# Patient Record
Sex: Male | Born: 1955 | Race: White | Hispanic: No | Smoking: Current every day smoker
Health system: Southern US, Community
[De-identification: ages and names within clinical notes are randomized; demographics above are authoritative.]

## PROBLEM LIST (undated history)

## (undated) DIAGNOSIS — I10 Essential (primary) hypertension: Secondary | ICD-10-CM

## (undated) DIAGNOSIS — E119 Type 2 diabetes mellitus without complications: Secondary | ICD-10-CM

## (undated) DIAGNOSIS — M109 Gout, unspecified: Secondary | ICD-10-CM

## (undated) HISTORY — PX: GASTRIC BYPASS: SHX52

---

## 2002-07-01 ENCOUNTER — Encounter: Admission: RE | Admit: 2002-07-01 | Discharge: 2002-09-29 | Payer: Self-pay | Admitting: Family Medicine

## 2002-11-03 ENCOUNTER — Encounter: Admission: RE | Admit: 2002-11-03 | Discharge: 2003-02-01 | Payer: Self-pay | Admitting: Family Medicine

## 2004-06-19 ENCOUNTER — Encounter (INDEPENDENT_AMBULATORY_CARE_PROVIDER_SITE_OTHER): Payer: Self-pay | Admitting: *Deleted

## 2004-06-19 ENCOUNTER — Ambulatory Visit (HOSPITAL_COMMUNITY): Admission: RE | Admit: 2004-06-19 | Discharge: 2004-06-19 | Payer: Self-pay | Admitting: Gastroenterology

## 2009-03-24 ENCOUNTER — Ambulatory Visit (HOSPITAL_BASED_OUTPATIENT_CLINIC_OR_DEPARTMENT_OTHER): Admission: RE | Admit: 2009-03-24 | Discharge: 2009-03-24 | Payer: Self-pay | Admitting: Podiatry

## 2009-06-28 ENCOUNTER — Ambulatory Visit (HOSPITAL_COMMUNITY): Admission: RE | Admit: 2009-06-28 | Discharge: 2009-06-28 | Payer: Self-pay | Admitting: Surgery

## 2009-07-05 ENCOUNTER — Ambulatory Visit (HOSPITAL_COMMUNITY): Admission: RE | Admit: 2009-07-05 | Discharge: 2009-07-05 | Payer: Self-pay | Admitting: Surgery

## 2009-08-14 ENCOUNTER — Ambulatory Visit (HOSPITAL_BASED_OUTPATIENT_CLINIC_OR_DEPARTMENT_OTHER): Admission: RE | Admit: 2009-08-14 | Discharge: 2009-08-14 | Payer: Self-pay | Admitting: Surgery

## 2009-08-16 ENCOUNTER — Encounter: Admission: RE | Admit: 2009-08-16 | Discharge: 2009-10-13 | Payer: Self-pay | Admitting: Surgery

## 2009-08-20 ENCOUNTER — Ambulatory Visit: Payer: Self-pay | Admitting: Internal Medicine

## 2009-08-29 ENCOUNTER — Ambulatory Visit (HOSPITAL_COMMUNITY): Admission: RE | Admit: 2009-08-29 | Discharge: 2009-08-29 | Payer: Self-pay | Admitting: Surgery

## 2009-11-01 ENCOUNTER — Ambulatory Visit (HOSPITAL_COMMUNITY): Admission: RE | Admit: 2009-11-01 | Discharge: 2009-11-01 | Payer: Self-pay | Admitting: Gastroenterology

## 2009-11-10 ENCOUNTER — Encounter
Admission: RE | Admit: 2009-11-10 | Discharge: 2010-01-24 | Payer: Self-pay | Source: Home / Self Care | Attending: Surgery | Admitting: Surgery

## 2009-11-23 ENCOUNTER — Ambulatory Visit (HOSPITAL_COMMUNITY): Admission: RE | Admit: 2009-11-23 | Discharge: 2009-11-23 | Payer: Self-pay | Admitting: Surgery

## 2010-01-10 ENCOUNTER — Ambulatory Visit (HOSPITAL_COMMUNITY)
Admission: RE | Admit: 2010-01-10 | Discharge: 2010-01-11 | Payer: Self-pay | Source: Home / Self Care | Attending: Surgery | Admitting: Surgery

## 2010-01-24 ENCOUNTER — Encounter: Admit: 2010-01-24 | Discharge: 2010-02-14 | Payer: Self-pay | Attending: Surgery | Admitting: Surgery

## 2010-03-07 ENCOUNTER — Encounter: Payer: BC Managed Care – PPO | Attending: Surgery | Admitting: *Deleted

## 2010-03-07 ENCOUNTER — Encounter: Admit: 2010-03-07 | Payer: Self-pay | Admitting: Surgery

## 2010-03-07 DIAGNOSIS — Z713 Dietary counseling and surveillance: Secondary | ICD-10-CM | POA: Insufficient documentation

## 2010-03-07 DIAGNOSIS — Z9884 Bariatric surgery status: Secondary | ICD-10-CM | POA: Insufficient documentation

## 2010-03-07 DIAGNOSIS — Z09 Encounter for follow-up examination after completed treatment for conditions other than malignant neoplasm: Secondary | ICD-10-CM | POA: Insufficient documentation

## 2010-03-27 LAB — DIFFERENTIAL
Basophils Absolute: 0 10*3/uL (ref 0.0–0.1)
Eosinophils Relative: 0 % (ref 0–5)
Lymphocytes Relative: 11 % — ABNORMAL LOW (ref 12–46)
Lymphocytes Relative: 30 % (ref 12–46)
Lymphs Abs: 1.9 10*3/uL (ref 0.7–4.0)
Lymphs Abs: 2.9 10*3/uL (ref 0.7–4.0)
Monocytes Absolute: 0.9 10*3/uL (ref 0.1–1.0)
Monocytes Relative: 10 % (ref 3–12)
Neutro Abs: 5.7 10*3/uL (ref 1.7–7.7)

## 2010-03-27 LAB — COMPREHENSIVE METABOLIC PANEL
Albumin: 3.9 g/dL (ref 3.5–5.2)
BUN: 13 mg/dL (ref 6–23)
Calcium: 9.5 mg/dL (ref 8.4–10.5)
Creatinine, Ser: 1.04 mg/dL (ref 0.4–1.5)
Total Protein: 7.7 g/dL (ref 6.0–8.3)

## 2010-03-27 LAB — CBC
HCT: 45.1 % (ref 39.0–52.0)
MCH: 31.8 pg (ref 26.0–34.0)
MCV: 95.8 fL (ref 78.0–100.0)
MCV: 95.8 fL (ref 78.0–100.0)
Platelets: 278 10*3/uL (ref 150–400)
Platelets: 316 10*3/uL (ref 150–400)
RBC: 4.71 MIL/uL (ref 4.22–5.81)
RDW: 13.6 % (ref 11.5–15.5)
WBC: 17.8 10*3/uL — ABNORMAL HIGH (ref 4.0–10.5)

## 2010-03-27 LAB — SURGICAL PCR SCREEN: Staphylococcus aureus: NEGATIVE

## 2010-03-28 LAB — COMPREHENSIVE METABOLIC PANEL
ALT: 63 U/L — ABNORMAL HIGH (ref 0–53)
AST: 56 U/L — ABNORMAL HIGH (ref 0–37)
Alkaline Phosphatase: 75 U/L (ref 39–117)
CO2: 30 mEq/L (ref 19–32)
Chloride: 97 mEq/L (ref 96–112)
Creatinine, Ser: 1.13 mg/dL (ref 0.4–1.5)
GFR calc Af Amer: >60 mL/min (ref >60)
GFR calc non Af Amer: >60 mL/min (ref >60)
Potassium: 4.5 mEq/L (ref 3.5–5.1)
Sodium: 140 mEq/L (ref 135–145)
Total Bilirubin: 1.1 mg/dL (ref 0.3–1.2)

## 2010-03-28 LAB — DIFFERENTIAL
Basophils Absolute: 0 10*3/uL (ref 0.0–0.1)
Eosinophils Absolute: 0.2 10*3/uL (ref 0.0–0.7)
Eosinophils Relative: 2 % (ref 0–5)

## 2010-03-28 LAB — SURGICAL PCR SCREEN: Staphylococcus aureus: NEGATIVE

## 2010-03-28 LAB — CBC
Hemoglobin: 16.3 g/dL (ref 13.0–17.0)
MCH: 33.2 pg (ref 26.0–34.0)
RBC: 4.91 MIL/uL (ref 4.22–5.81)

## 2010-04-10 LAB — POCT I-STAT 4, (NA,K, GLUC, HGB,HCT): Sodium: 141 mEq/L (ref 135–145)

## 2010-05-04 ENCOUNTER — Encounter: Payer: BC Managed Care – PPO | Attending: Surgery | Admitting: *Deleted

## 2010-05-04 DIAGNOSIS — Z09 Encounter for follow-up examination after completed treatment for conditions other than malignant neoplasm: Secondary | ICD-10-CM | POA: Insufficient documentation

## 2010-05-04 DIAGNOSIS — Z9884 Bariatric surgery status: Secondary | ICD-10-CM | POA: Insufficient documentation

## 2010-05-04 DIAGNOSIS — Z713 Dietary counseling and surveillance: Secondary | ICD-10-CM | POA: Insufficient documentation

## 2010-06-02 NOTE — Op Note (Signed)
Trevor Serrano, Trevor Serrano                ACCOUNT NO.:  [id number]   MEDICAL RECORD NO.:  [id number]          PATIENT TYPE:  AMB   LOCATION:  ENDO                         FACILITY:  [facility]   PHYSICIAN:  Graylin Shiver, M.D.   DATE OF BIRTH:  09/03/1955   DATE OF PROCEDURE:  06/19/2004  DATE OF DISCHARGE:                                 OPERATIVE REPORT   PROCEDURE:  Colonoscopy with biopsy.   ENDOSCOPIST:  Graylin Shiver, M.D.   INDICATION:  Screening.   Informed consent was obtained after explanation of the risks of bleeding,  infection, and perforation.   PREMEDICATION:  Fentanyl 75 mcg IV, Versed 6 mg IV.   PROCEDURE:  With the patient in the left lateral decubitus position, a  rectal exam was performed. No masses were felt. The Olympus colonoscope was  inserted into the rectum and advanced around the colon to the cecum. Cecal  landmarks were identified. The colon was tortuous. The cecum and ascending  colon were normal. The transverse colon normal. The descending colon normal.  In the sigmoid, there was a 3-mm polyp biopsied off with cold forceps. The  rectum looked normal. He tolerated the procedure well without complications.   IMPRESSION:  Small sigmoid polyp.   PLAN:  The pathology will be checked.      SFG/MEDQ  D:  06/19/2004  T:  06/19/2004  Job:  [id number]   cc:   Caryn Bee L. Little, M.D.  [address]  [place]  [place] [address]  Fax: [phone]

## 2010-07-03 ENCOUNTER — Ambulatory Visit: Payer: BC Managed Care – PPO | Admitting: *Deleted

## 2010-08-03 ENCOUNTER — Encounter (INDEPENDENT_AMBULATORY_CARE_PROVIDER_SITE_OTHER): Payer: BC Managed Care – PPO | Admitting: Surgery

## 2010-08-07 ENCOUNTER — Ambulatory Visit: Payer: BC Managed Care – PPO | Admitting: *Deleted

## 2010-11-08 NOTE — Op Note (Signed)
[name], Trevor Serrano                ACCOUNT NO.:  [id number]  MEDICAL RECORD NO.:  [id number]          PATIENT TYPE:  AMB  LOCATION:  [facility]                         FACILITY:  [facility]  PHYSICIAN:  Ezequiel Kayser. Rogan Ecklund, D.P.M.DATE OF BIRTH:  01/03/1956  DATE OF PROCEDURE:  03/24/2009 DATE OF DISCHARGE:  03/24/2009                              OPERATIVE REPORT  SURGEON:  Ezequiel Kayser. Harriet Pho, DPM  ASSISTANT:  None.  PREOPERATIVE DIAGNOSIS:  Hallux rigidus, right foot.  POSTOPERATIVE DIAGNOSIS:  Hallux rigidus, right foot.  PROCEDURE:  Right first metatarsophalangeal joint replacement with implant.  ANESTHESIA:  General.  COMPLICATIONS:  None.  DESCRIPTION OF PROCEDURE:  The patient was brought to the OR and placed in supine position at which time general anesthesia was administered. The foot was prepped and draped in the usual sterile fashion.  The previously applied tourniquet was inflated to 250 mmHg after combination of 0.5% Marcaine plain and 1% lidocaine plain was infiltrated around the surgical site.  Next, a dorsolinear incision was made.  The incision was deepened via sharp modalities, taking care to clamp and cauterize all bleeding vessels and ensuring retraction of all neurovascular structures encountered.  Deep and superficial fascia were separated medially and laterally the length of the incision.  A linear capsulotomy was performed and the capsule and periosteum freed from the head of the first metatarsal and proximal phalanx.  Once exposed, it was noted that there was a large hypertrophic bone lipping and at the head of the first metatarsal, the dorsal two-thirds of the cartilage was completely eroded.  The medial eminence was resected parallel with the shaft.  All hypertrophic bone lipping was resected and care was taken to round the first metatarsal head with the power bur and a power rasp.  Next, attention was directed to the base of the proximal phalanx  where approximately 6-7 mm of bone was resected at the base of the proximal phalanx.  This was carefully dissected taking care not to disrupt the plantar apparatus or flexor tendons.  Next, a drill was used to penetrate the remaining base of the proximal phalanx so that a sizer was used for the first MTP joint implant, which determined that a medium large implant was the appropriate size.  Care was taken to ensure that the joint was congruous and any hypertrophic bone or impingement was resected and bone smooth.  The sizer was removed.  Surgical wound irrigated with copious amounts of sterile saline.  Marney Doctor was used into the proximal phalanx and then the medium large cobalt chromium BioPro implant was inserted into the proximal phalanx.  Placement was confirmed with intraoperative radiographs and found to be in excellent position. Surgical wound was irrigated with copious amounts of sterile saline and antibiotic solution.  Capsule and periosteum were reapproximated using 3- 0 Vicryl, deep closure accomplished with 4-0 Vicryl, and skin closure accomplished with 4-0 Monocryl in a running subcuticular fashion.  The patient was sent to the recovery room with vital signs stable and capillary refill time at presurgical levels.  All instructions given.  No guarantees given.  ______________________________ Ezequiel Kayser. Harriet Pho, D.P.M.    MJA/MEDQ  D:  03/29/2009  T:  03/30/2009  Job:  [id number]  Electronically Signed by Larey Dresser D.P.M. on 11/08/2010 05:54:10 PM

## 2011-10-08 ENCOUNTER — Telehealth (INDEPENDENT_AMBULATORY_CARE_PROVIDER_SITE_OTHER): Payer: Self-pay | Admitting: Surgery

## 2011-10-08 NOTE — Telephone Encounter (Signed)
I spoke with the patient via phone to schedule his follow-up appointment for lap band surgery. He stated that he does not wish to schedule an appointment at this time...cef

## 2011-10-09 ENCOUNTER — Other Ambulatory Visit: Payer: Self-pay | Admitting: Family Medicine

## 2011-10-09 DIAGNOSIS — M79609 Pain in unspecified limb: Secondary | ICD-10-CM

## 2011-10-13 ENCOUNTER — Ambulatory Visit
Admission: RE | Admit: 2011-10-13 | Discharge: 2011-10-13 | Disposition: A | Discharge status: Home / Self Care | Payer: BC Managed Care – PPO | Source: Ambulatory Visit | Attending: Family Medicine | Admitting: Family Medicine

## 2011-10-13 DIAGNOSIS — M79609 Pain in unspecified limb: Secondary | ICD-10-CM

## 2013-11-10 ENCOUNTER — Emergency Department (HOSPITAL_BASED_OUTPATIENT_CLINIC_OR_DEPARTMENT_OTHER): Payer: BC Managed Care – PPO

## 2013-11-10 ENCOUNTER — Encounter (HOSPITAL_BASED_OUTPATIENT_CLINIC_OR_DEPARTMENT_OTHER): Payer: Self-pay | Admitting: Emergency Medicine

## 2013-11-10 ENCOUNTER — Emergency Department (HOSPITAL_BASED_OUTPATIENT_CLINIC_OR_DEPARTMENT_OTHER)
Admission: EM | Admit: 2013-11-10 | Discharge: 2013-11-10 | Disposition: A | Discharge status: Home / Self Care | Payer: BC Managed Care – PPO | Attending: Emergency Medicine | Admitting: Emergency Medicine

## 2013-11-10 DIAGNOSIS — E119 Type 2 diabetes mellitus without complications: Secondary | ICD-10-CM | POA: Insufficient documentation

## 2013-11-10 DIAGNOSIS — M7989 Other specified soft tissue disorders: Secondary | ICD-10-CM | POA: Insufficient documentation

## 2013-11-10 DIAGNOSIS — Z79899 Other long term (current) drug therapy: Secondary | ICD-10-CM | POA: Insufficient documentation

## 2013-11-10 DIAGNOSIS — Z720 Tobacco use: Secondary | ICD-10-CM | POA: Insufficient documentation

## 2013-11-10 HISTORY — DX: Gout, unspecified: M10.9

## 2013-11-10 HISTORY — DX: Type 2 diabetes mellitus without complications: E11.9

## 2013-11-10 LAB — COMPREHENSIVE METABOLIC PANEL
ALBUMIN: 3.3 g/dL — AB (ref 3.5–5.2)
ALK PHOS: 101 U/L (ref 39–117)
ALT: 22 U/L (ref 0–53)
AST: 20 U/L (ref 0–37)
Anion gap: 13 (ref 5–15)
BILIRUBIN TOTAL: 0.6 mg/dL (ref 0.3–1.2)
BUN: 13 mg/dL (ref 6–23)
CHLORIDE: 99 meq/L (ref 96–112)
CO2: 28 mEq/L (ref 19–32)
Calcium: 9.5 mg/dL (ref 8.4–10.5)
Creatinine, Ser: 0.9 mg/dL (ref 0.50–1.35)
GFR calc Af Amer: >90 mL/min (ref >90)
GFR calc non Af Amer: >90 mL/min (ref >90)
Glucose, Bld: 115 mg/dL — ABNORMAL HIGH (ref 70–99)
Potassium: 4 mEq/L (ref 3.7–5.3)
SODIUM: 140 meq/L (ref 137–147)
Total Protein: 7.9 g/dL (ref 6.0–8.3)

## 2013-11-10 LAB — CBC WITH DIFFERENTIAL/PLATELET
BASOS ABS: 0 10*3/uL (ref 0.0–0.1)
Basophils Relative: 0 % (ref 0–1)
Eosinophils Absolute: 0.1 10*3/uL (ref 0.0–0.7)
Eosinophils Relative: 1 % (ref 0–5)
HCT: 45 % (ref 39.0–52.0)
HEMOGLOBIN: 15.1 g/dL (ref 13.0–17.0)
Lymphocytes Relative: 21 % (ref 12–46)
Lymphs Abs: 2.5 10*3/uL (ref 0.7–4.0)
MCH: 30.6 pg (ref 26.0–34.0)
MCHC: 33.6 g/dL (ref 30.0–36.0)
MCV: 91.3 fL (ref 78.0–100.0)
MONOS PCT: 8 % (ref 3–12)
Monocytes Absolute: 1 10*3/uL (ref 0.1–1.0)
NEUTROS PCT: 70 % (ref 43–77)
Neutro Abs: 8.3 10*3/uL — ABNORMAL HIGH (ref 1.7–7.7)
Platelets: 307 10*3/uL (ref 150–400)
RBC: 4.93 MIL/uL (ref 4.22–5.81)
RDW: 14.8 % (ref 11.5–15.5)
WBC: 11.9 10*3/uL — ABNORMAL HIGH (ref 4.0–10.5)

## 2013-11-10 MED ORDER — INDOMETHACIN 25 MG PO CAPS: 25.0000 mg | ORAL_CAPSULE | Freq: Three times a day (TID) | ORAL | Status: DC | PRN

## 2013-11-10 MED ORDER — CLINDAMYCIN HCL 300 MG PO CAPS: 300.0000 mg | ORAL_CAPSULE | Freq: Four times a day (QID) | ORAL | Status: DC

## 2013-11-10 NOTE — ED Provider Notes (Signed)
CSN: [id number]     Arrival date & time 11/10/13  1450 History   First MD Initiated Contact with Patient 11/10/13 1515     Chief Complaint  Patient presents with  . Leg Swelling     (Consider location/radiation/quality/duration/timing/severity/associated sxs/prior Treatment) Patient is a 58 y.o. male presenting with leg pain. The history is provided by the patient.  Leg Pain Location:  Leg Time since incident:  1 week Injury: no   Leg location:  L leg Pain details:    Quality: no significant pain.   Radiates to:  Does not radiate   Severity:  Mild   Onset quality:  Gradual   Duration:  1 week   Timing:  Constant   Progression:  Worsening Chronicity:  New Foreign body present:  No foreign bodies Prior injury to area:  No Relieved by:  Nothing Worsened by:  Nothing tried Ineffective treatments:  None tried Associated symptoms: no fever and no neck pain     Past Medical History  Diagnosis Date  . Diabetes mellitus without complication   . Gout    Past Surgical History  Procedure Laterality Date  . Gastric bypass     History reviewed. No pertinent family history. History  Substance Use Topics  . Smoking status: Current Every Day Smoker -- 1.00 packs/day    Types: Cigarettes  . Smokeless tobacco: Not on file  . Alcohol Use: No    Review of Systems  Constitutional: Negative for fever.  HENT: Negative for drooling and rhinorrhea.   Eyes: Negative for pain.  Respiratory: Negative for cough and shortness of breath.   Cardiovascular: Negative for chest pain and leg swelling.  Gastrointestinal: Negative for nausea, vomiting, abdominal pain and diarrhea.  Genitourinary: Negative for dysuria and hematuria.  Musculoskeletal: Negative for gait problem and neck pain.  Skin: Negative for color change.  Neurological: Negative for numbness and headaches.  Hematological: Negative for adenopathy.  Psychiatric/Behavioral: Negative for behavioral problems.  All other systems  reviewed and are negative.     Allergies  Review of patient's allergies indicates no known allergies.  Home Medications   Prior to Admission medications   Medication Sig Start Date End Date Taking? Authorizing Provider  allopurinol (ZYLOPRIM) 100 MG tablet Take 100 mg by mouth daily.   Yes Historical Provider, MD  amLODipine (NORVASC) 10 MG tablet Take 10 mg by mouth daily.   Yes Historical Provider, MD  buPROPion (WELLBUTRIN) 100 MG tablet Take 200 mg by mouth 2 (two) times daily.   Yes Historical Provider, MD  cetirizine (ZYRTEC) 10 MG tablet Take 10 mg by mouth daily.   Yes Historical Provider, MD  esomeprazole (NEXIUM) 40 MG capsule Take 40 mg by mouth daily at 12 noon.   Yes Historical Provider, MD  ferrous sulfate 325 (65 FE) MG tablet Take 325 mg by mouth daily with breakfast.   Yes Historical Provider, MD  hydrochlorothiazide (MICROZIDE) 12.5 MG capsule Take 12.5 mg by mouth daily.   Yes Historical Provider, MD  indomethacin (INDOCIN) 50 MG capsule Take 50 mg by mouth 2 (two) times daily with a meal.   Yes Historical Provider, MD  metoprolol (LOPRESSOR) 50 MG tablet Take 50 mg by mouth 2 (two) times daily.   Yes Historical Provider, MD  sildenafil (VIAGRA) 100 MG tablet Take 100 mg by mouth daily as needed for erectile dysfunction.   Yes Historical Provider, MD  telmisartan (MICARDIS) 40 MG tablet Take 40 mg by mouth daily.   Yes Historical Provider,  MD   BP 153/82  Pulse 75  Temp(Src) 98.2 F (36.8 C)  Resp 20  Ht 5\' 10"  (1.778 m)  Wt 348 lb (157.852 kg)  BMI 49.93 kg/m2  SpO2 100% Physical Exam  Nursing note and vitals reviewed. Constitutional: He is oriented to person, place, and time. He appears well-developed and well-nourished.  HENT:  Head: Normocephalic and atraumatic.  Right Ear: External ear normal.  Left Ear: External ear normal.  Nose: Nose normal.  Mouth/Throat: Oropharynx is clear and moist. No oropharyngeal exudate.  Eyes: Conjunctivae and EOM are  normal. Pupils are equal, round, and reactive to light.  Neck: Normal range of motion. Neck supple.  Cardiovascular: Normal rate, regular rhythm, normal heart sounds and intact distal pulses.  Exam reveals no gallop and no friction rub.   No murmur heard. Pulmonary/Chest: Effort normal and breath sounds normal. No respiratory distress. He has no wheezes.  Abdominal: Soft. Bowel sounds are normal. He exhibits no distension. There is no tenderness. There is no rebound and no guarding.  Musculoskeletal: Normal range of motion.  Mild erythema and circumferential swelling extending from the left ankle to the proximal tibia area.  No focal tenderness of the bilateral lower extremities.  2+ distal DP pulses in bilateral lower extremities.  Neurological: He is alert and oriented to person, place, and time.  Skin: Skin is warm and dry.  Psychiatric: He has a normal mood and affect. His behavior is normal.    ED Course  Procedures (including critical care time) Labs Review Labs Reviewed  CBC WITH DIFFERENTIAL - Abnormal; Notable for the following:    WBC 11.9 (*)    Neutro Abs 8.3 (*)    All other components within normal limits  COMPREHENSIVE METABOLIC PANEL - Abnormal; Notable for the following:    Glucose, Bld 115 (*)    Albumin 3.3 (*)    All other components within normal limits    Imaging Review [place] Venous Img Lower Unilateral Left  11/10/2013   CLINICAL DATA:  Left lower extremity swelling and redness for 1 week, progressive.  EXAM: LEFT LOWER EXTREMITY VENOUS DOPPLER ULTRASOUND  TECHNIQUE: Gray-scale sonography with graded compression, as well as color Doppler and duplex ultrasound were performed to evaluate the lower extremity deep venous systems from the level of the common femoral vein and including the common femoral, femoral, profunda femoral, popliteal and calf veins including the posterior tibial, peroneal and gastrocnemius veins when visible. The superficial great saphenous  vein was also interrogated. Spectral Doppler was utilized to evaluate flow at rest and with distal augmentation maneuvers in the common femoral, femoral and popliteal veins.  COMPARISON:  None.  FINDINGS: Common Femoral Vein: No evidence of thrombus. Normal compressibility, respiratory phasicity and response to augmentation.  Saphenofemoral Junction: No evidence of thrombus. Normal compressibility and flow on color Doppler imaging.  Profunda Femoral Vein: No evidence of thrombus. Normal compressibility and flow on color Doppler imaging.  Femoral Vein: No evidence of thrombus. Normal compressibility, respiratory phasicity and response to augmentation.  Popliteal Vein: No evidence of thrombus. Normal compressibility, respiratory phasicity and response to augmentation.  Calf Veins: No evidence of thrombus. Normal compressibility and flow on color Doppler imaging.  Superficial Great Saphenous Vein: No evidence of thrombus. Normal compressibility and flow on color Doppler imaging.  Venous Reflux:  None.  Other Findings: There is slight subcutaneous edema in the left leg.  IMPRESSION: No evidence of deep venous thrombosis.   Electronically Signed   By: Geanie CooleyJim  Maxwell  M.D.   On: 11/10/2013 17:01     EKG Interpretation None      MDM   Final diagnoses:  Left leg swelling    3:30 PM 58 y.o. male w hx of DM, gout who presents with left lower extremity redness and swelling over the last week. He denies any injury, pain, or fever. He states that he is otherwise very active. He is Afebrile and vital signs are unremarkable here. He was seen here by his PCP for an ultrasound. We'll get screening labs and ultrasound of his left lower extremity to rule out DVT.   5:47 PM: [place] neg for DVT. Ddx also includes cellulitis. Will cover w/ clindamycin and have pt monitor margins of erythema. Pt also suspicious for gout flare in left ankle. Will start indocin. I have discussed the diagnosis/risks/treatment options with the  patient and believe the pt to be eligible for discharge home to follow-up with his pcp in 2-3 days. We also discussed returning to the ED immediately if new or worsening sx occur. We discussed the sx which are most concerning (e.g., fever, spreading redness) that necessitate immediate return. Medications administered to the patient during their visit and any new prescriptions provided to the patient are listed below.  Medications given during this visit Medications - No data to display  Discharge Medication List as of 11/10/2013  5:52 PM    START taking these medications   Details  clindamycin (CLEOCIN) 300 MG capsule Take 1 capsule (300 mg total) by mouth 4 (four) times daily. X 10 days, Starting 11/10/2013, Until Discontinued, Print    !! indomethacin (INDOCIN) 25 MG capsule Take 1 capsule (25 mg total) by mouth 3 (three) times daily as needed., Starting 11/10/2013, Until Discontinued, Print     !! - Potential duplicate medications found. Please discuss with provider.       Purvis Sheffield, MD 11/10/13 2332

## 2013-11-10 NOTE — ED Notes (Signed)
Pt sent here from PMD office for left lower leg swelling x 1 week

## 2013-11-10 NOTE — ED Notes (Signed)
MD at bedside. 

## 2015-10-21 ENCOUNTER — Encounter (HOSPITAL_COMMUNITY): Payer: Self-pay

## 2016-08-15 ENCOUNTER — Encounter (HOSPITAL_COMMUNITY): Payer: Self-pay

## 2016-09-03 ENCOUNTER — Ambulatory Visit
Admission: RE | Admit: 2016-09-03 | Discharge: 2016-09-03 | Disposition: A | Discharge status: Home / Self Care | Payer: BLUE CROSS/BLUE SHIELD | Source: Ambulatory Visit | Attending: Family Medicine | Admitting: Family Medicine

## 2016-09-03 ENCOUNTER — Other Ambulatory Visit: Payer: Self-pay | Admitting: Family Medicine

## 2016-09-03 DIAGNOSIS — M7989 Other specified soft tissue disorders: Secondary | ICD-10-CM

## 2017-07-14 DIAGNOSIS — R300 Dysuria: Secondary | ICD-10-CM | POA: Insufficient documentation

## 2017-07-14 DIAGNOSIS — A419 Sepsis, unspecified organism: Secondary | ICD-10-CM | POA: Diagnosis present

## 2017-07-14 DIAGNOSIS — N179 Acute kidney failure, unspecified: Secondary | ICD-10-CM | POA: Diagnosis present

## 2017-07-14 DIAGNOSIS — E872 Acidosis, unspecified: Secondary | ICD-10-CM | POA: Insufficient documentation

## 2017-07-14 DIAGNOSIS — R160 Hepatomegaly, not elsewhere classified: Secondary | ICD-10-CM | POA: Diagnosis present

## 2017-07-15 ENCOUNTER — Inpatient Hospital Stay (HOSPITAL_COMMUNITY)
Admission: AD | Admit: 2017-07-15 | Discharge: 2017-07-18 | DRG: 393 | Disposition: A | Discharge status: Home / Self Care | Payer: BLUE CROSS/BLUE SHIELD | Source: Other Acute Inpatient Hospital | Attending: Surgery | Admitting: Surgery

## 2017-07-15 ENCOUNTER — Other Ambulatory Visit: Payer: Self-pay

## 2017-07-15 DIAGNOSIS — F329 Major depressive disorder, single episode, unspecified: Secondary | ICD-10-CM | POA: Insufficient documentation

## 2017-07-15 DIAGNOSIS — E1165 Type 2 diabetes mellitus with hyperglycemia: Secondary | ICD-10-CM | POA: Diagnosis present

## 2017-07-15 DIAGNOSIS — R7881 Bacteremia: Secondary | ICD-10-CM | POA: Diagnosis not present

## 2017-07-15 DIAGNOSIS — I110 Hypertensive heart disease with heart failure: Secondary | ICD-10-CM | POA: Diagnosis present

## 2017-07-15 DIAGNOSIS — R160 Hepatomegaly, not elsewhere classified: Secondary | ICD-10-CM | POA: Diagnosis not present

## 2017-07-15 DIAGNOSIS — F17200 Nicotine dependence, unspecified, uncomplicated: Secondary | ICD-10-CM | POA: Insufficient documentation

## 2017-07-15 DIAGNOSIS — M5136 Other intervertebral disc degeneration, lumbar region: Secondary | ICD-10-CM | POA: Insufficient documentation

## 2017-07-15 DIAGNOSIS — Z6841 Body Mass Index (BMI) 40.0 and over, adult: Secondary | ICD-10-CM | POA: Diagnosis not present

## 2017-07-15 DIAGNOSIS — E785 Hyperlipidemia, unspecified: Secondary | ICD-10-CM | POA: Diagnosis present

## 2017-07-15 DIAGNOSIS — K9509 Other complications of gastric band procedure: Secondary | ICD-10-CM | POA: Diagnosis present

## 2017-07-15 DIAGNOSIS — I5041 Acute combined systolic (congestive) and diastolic (congestive) heart failure: Secondary | ICD-10-CM | POA: Diagnosis present

## 2017-07-15 DIAGNOSIS — Z79899 Other long term (current) drug therapy: Secondary | ICD-10-CM | POA: Diagnosis not present

## 2017-07-15 DIAGNOSIS — J309 Allergic rhinitis, unspecified: Secondary | ICD-10-CM | POA: Insufficient documentation

## 2017-07-15 DIAGNOSIS — K3189 Other diseases of stomach and duodenum: Secondary | ICD-10-CM | POA: Diagnosis present

## 2017-07-15 DIAGNOSIS — R0902 Hypoxemia: Secondary | ICD-10-CM | POA: Diagnosis present

## 2017-07-15 DIAGNOSIS — K750 Abscess of liver: Secondary | ICD-10-CM

## 2017-07-15 DIAGNOSIS — R339 Retention of urine, unspecified: Secondary | ICD-10-CM | POA: Diagnosis present

## 2017-07-15 DIAGNOSIS — Z7982 Long term (current) use of aspirin: Secondary | ICD-10-CM

## 2017-07-15 DIAGNOSIS — G4730 Sleep apnea, unspecified: Secondary | ICD-10-CM | POA: Diagnosis present

## 2017-07-15 DIAGNOSIS — Y831 Surgical operation with implant of artificial internal device as the cause of abnormal reaction of the patient, or of later complication, without mention of misadventure at the time of the procedure: Secondary | ICD-10-CM | POA: Diagnosis present

## 2017-07-15 DIAGNOSIS — M109 Gout, unspecified: Secondary | ICD-10-CM | POA: Diagnosis present

## 2017-07-15 DIAGNOSIS — R739 Hyperglycemia, unspecified: Secondary | ICD-10-CM | POA: Insufficient documentation

## 2017-07-15 DIAGNOSIS — I5030 Unspecified diastolic (congestive) heart failure: Secondary | ICD-10-CM | POA: Diagnosis not present

## 2017-07-15 DIAGNOSIS — K760 Fatty (change of) liver, not elsewhere classified: Secondary | ICD-10-CM | POA: Diagnosis present

## 2017-07-15 DIAGNOSIS — Z978 Presence of other specified devices: Secondary | ICD-10-CM | POA: Diagnosis not present

## 2017-07-15 DIAGNOSIS — Z9119 Patient's noncompliance with other medical treatment and regimen: Secondary | ICD-10-CM

## 2017-07-15 DIAGNOSIS — M47816 Spondylosis without myelopathy or radiculopathy, lumbar region: Secondary | ICD-10-CM | POA: Insufficient documentation

## 2017-07-15 DIAGNOSIS — K651 Peritoneal abscess: Secondary | ICD-10-CM

## 2017-07-15 DIAGNOSIS — K219 Gastro-esophageal reflux disease without esophagitis: Secondary | ICD-10-CM | POA: Diagnosis present

## 2017-07-15 DIAGNOSIS — F17210 Nicotine dependence, cigarettes, uncomplicated: Secondary | ICD-10-CM | POA: Diagnosis present

## 2017-07-15 DIAGNOSIS — F32A Depression, unspecified: Secondary | ICD-10-CM | POA: Insufficient documentation

## 2017-07-15 DIAGNOSIS — A419 Sepsis, unspecified organism: Secondary | ICD-10-CM | POA: Diagnosis present

## 2017-07-15 DIAGNOSIS — Z9884 Bariatric surgery status: Secondary | ICD-10-CM | POA: Diagnosis not present

## 2017-07-15 DIAGNOSIS — I10 Essential (primary) hypertension: Secondary | ICD-10-CM | POA: Diagnosis not present

## 2017-07-15 DIAGNOSIS — Z96698 Presence of other orthopedic joint implants: Secondary | ICD-10-CM | POA: Diagnosis present

## 2017-07-15 DIAGNOSIS — N179 Acute kidney failure, unspecified: Secondary | ICD-10-CM | POA: Diagnosis present

## 2017-07-15 LAB — COMPREHENSIVE METABOLIC PANEL
ALBUMIN: 2.1 g/dL — AB (ref 3.5–5.0)
ALT: 25 U/L (ref 0–44)
AST: 33 U/L (ref 15–41)
Alkaline Phosphatase: 125 U/L (ref 38–126)
Anion gap: 11 (ref 5–15)
BUN: 37 mg/dL — AB (ref 8–23)
CHLORIDE: 105 mmol/L (ref 98–111)
CO2: 19 mmol/L — AB (ref 22–32)
Calcium: 7.9 mg/dL — ABNORMAL LOW (ref 8.9–10.3)
Creatinine, Ser: 1.97 mg/dL — ABNORMAL HIGH (ref 0.61–1.24)
GFR calc Af Amer: 40 mL/min — ABNORMAL LOW (ref >60)
GFR calc non Af Amer: 34 mL/min — ABNORMAL LOW (ref >60)
GLUCOSE: 185 mg/dL — AB (ref 70–99)
POTASSIUM: 3.9 mmol/L (ref 3.5–5.1)
SODIUM: 135 mmol/L (ref 135–145)
Total Bilirubin: 2.2 mg/dL — ABNORMAL HIGH (ref 0.3–1.2)
Total Protein: 6.5 g/dL (ref 6.5–8.1)

## 2017-07-15 LAB — PROTIME-INR
INR: 1.18
Prothrombin Time: 14.9 seconds (ref 11.4–15.2)

## 2017-07-15 LAB — CBC WITH DIFFERENTIAL/PLATELET
BASOS PCT: 0 %
Basophils Absolute: 0 10*3/uL (ref 0.0–0.1)
EOS ABS: 0 10*3/uL (ref 0.0–0.7)
EOS PCT: 0 %
HCT: 37.1 % — ABNORMAL LOW (ref 39.0–52.0)
HEMOGLOBIN: 12.8 g/dL — AB (ref 13.0–17.0)
Lymphocytes Relative: 5 %
Lymphs Abs: 0.9 10*3/uL (ref 0.7–4.0)
MCH: 31.4 pg (ref 26.0–34.0)
MCHC: 34.5 g/dL (ref 30.0–36.0)
MCV: 91.2 fL (ref 78.0–100.0)
Monocytes Absolute: 0.6 10*3/uL (ref 0.1–1.0)
Monocytes Relative: 3 %
NEUTROS PCT: 92 %
Neutro Abs: 16.6 10*3/uL — ABNORMAL HIGH (ref 1.7–7.7)
PLATELETS: 341 10*3/uL (ref 150–400)
RBC: 4.07 MIL/uL — AB (ref 4.22–5.81)
RDW: 14.5 % (ref 11.5–15.5)
WBC: 18.2 10*3/uL — AB (ref 4.0–10.5)

## 2017-07-15 LAB — APTT: aPTT: 32 seconds (ref 24–36)

## 2017-07-15 LAB — PHOSPHORUS: Phosphorus: 4.3 mg/dL (ref 2.5–4.6)

## 2017-07-15 LAB — TSH: TSH: 0.72 u[IU]/mL (ref 0.350–4.500)

## 2017-07-15 LAB — MAGNESIUM: Magnesium: 2 mg/dL (ref 1.7–2.4)

## 2017-07-15 MED ORDER — FLUCONAZOLE IN SODIUM CHLORIDE 400-0.9 MG/200ML-% IV SOLN
400.00 | INTRAVENOUS | Status: DC
Start: ? — End: 2017-07-15

## 2017-07-15 MED ORDER — METHYLPREDNISOLONE SODIUM SUCC 40 MG IJ SOLR
40.00 | INTRAMUSCULAR | Status: DC
Start: ? — End: 2017-07-15

## 2017-07-15 MED ORDER — VANCOMYCIN HCL IN DEXTROSE 1-5 GM/200ML-% IV SOLN
1000.0000 mg | Freq: Once | INTRAVENOUS | Status: AC
  Administered 2017-07-15: 1000 mg via INTRAVENOUS
  Filled 2017-07-15: qty 200

## 2017-07-15 MED ORDER — GENERIC EXTERNAL MEDICATION
Status: DC
Start: ? — End: 2017-07-15

## 2017-07-15 MED ORDER — PANTOPRAZOLE SODIUM 40 MG IV SOLR
40.00 | INTRAVENOUS | Status: DC
Start: 2017-07-15 — End: 2017-07-15

## 2017-07-15 MED ORDER — ALBUTEROL SULFATE (2.5 MG/3ML) 0.083% IN NEBU
2.50 | INHALATION_SOLUTION | RESPIRATORY_TRACT | Status: DC
Start: ? — End: 2017-07-15

## 2017-07-15 MED ORDER — LOSARTAN POTASSIUM 25 MG PO TABS
25.00 | ORAL_TABLET | ORAL | Status: DC
Start: 2017-07-16 — End: 2017-07-15

## 2017-07-15 MED ORDER — FAMOTIDINE IN NACL 20-0.9 MG/50ML-% IV SOLN
20.0000 mg | Freq: Two times a day (BID) | INTRAVENOUS | Status: DC
  Administered 2017-07-15 – 2017-07-17 (×4): 20 mg via INTRAVENOUS
  Filled 2017-07-15 (×4): qty 50

## 2017-07-15 MED ORDER — GENERIC EXTERNAL MEDICATION
3.38 | Status: DC
Start: 2017-07-16 — End: 2017-07-15

## 2017-07-15 MED ORDER — ACETAMINOPHEN 325 MG PO TABS
650.00 | ORAL_TABLET | ORAL | Status: DC
Start: ? — End: 2017-07-15

## 2017-07-15 MED ORDER — ONDANSETRON 4 MG PO TBDP: 4.0000 mg | ORAL_TABLET | Freq: Four times a day (QID) | ORAL | Status: DC | PRN

## 2017-07-15 MED ORDER — HYDRALAZINE HCL 20 MG/ML IJ SOLN
10.00 | INTRAMUSCULAR | Status: DC
Start: ? — End: 2017-07-15

## 2017-07-15 MED ORDER — VANCOMYCIN HCL IN NACL 2-0.9 GM/500ML-% IV SOLN
2.00 | INTRAVENOUS | Status: DC
Start: 2017-07-16 — End: 2017-07-15

## 2017-07-15 MED ORDER — NITROGLYCERIN 0.4 MG SL SUBL
0.40 | SUBLINGUAL_TABLET | SUBLINGUAL | Status: DC
Start: ? — End: 2017-07-15

## 2017-07-15 MED ORDER — ONDANSETRON HCL 4 MG/2ML IJ SOLN: 4.0000 mg | Freq: Four times a day (QID) | INTRAMUSCULAR | Status: DC | PRN

## 2017-07-15 MED ORDER — DEXTROSE IN LACTATED RINGERS 5 % IV SOLN
INTRAVENOUS | Status: DC
  Administered 2017-07-15: 22:00:00 via INTRAVENOUS

## 2017-07-15 MED ORDER — METOPROLOL TARTRATE 50 MG PO TABS
50.0000 mg | ORAL_TABLET | Freq: Every day | ORAL | Status: DC
  Administered 2017-07-16 – 2017-07-17 (×2): 50 mg via ORAL
  Filled 2017-07-15 (×3): qty 1

## 2017-07-15 MED ORDER — BUPROPION HCL 100 MG PO TABS
100.00 | ORAL_TABLET | ORAL | Status: DC
Start: 2017-07-15 — End: 2017-07-15

## 2017-07-15 MED ORDER — ATORVASTATIN CALCIUM 20 MG PO TABS
20.00 | ORAL_TABLET | ORAL | Status: DC
Start: 2017-07-15 — End: 2017-07-15

## 2017-07-15 MED ORDER — HYDRALAZINE HCL 20 MG/ML IJ SOLN: 10.0000 mg | INTRAMUSCULAR | Status: DC | PRN

## 2017-07-15 MED ORDER — PIPERACILLIN-TAZOBACTAM 3.375 G IVPB
3.3750 g | Freq: Three times a day (TID) | INTRAVENOUS | Status: DC
  Administered 2017-07-15 – 2017-07-17 (×6): 3.375 g via INTRAVENOUS
  Filled 2017-07-15 (×7): qty 50

## 2017-07-15 MED ORDER — MORPHINE SULFATE (PF) 2 MG/ML IV SOLN
1.0000 mg | INTRAVENOUS | Status: DC | PRN
  Administered 2017-07-16: 2 mg via INTRAVENOUS
  Filled 2017-07-15: qty 1

## 2017-07-15 MED ORDER — MEPERIDINE HCL 25 MG/ML IJ SOLN
25.00 | INTRAMUSCULAR | Status: DC
Start: ? — End: 2017-07-15

## 2017-07-15 MED ORDER — SODIUM CHLORIDE 0.9 % IV SOLN
125.00 | INTRAVENOUS | Status: DC
Start: ? — End: 2017-07-15

## 2017-07-15 MED ORDER — ASPIRIN 81 MG PO CHEW
81.00 | CHEWABLE_TABLET | ORAL | Status: DC
Start: 2017-07-16 — End: 2017-07-15

## 2017-07-15 MED ORDER — ALLOPURINOL 100 MG PO TABS
200.00 | ORAL_TABLET | ORAL | Status: DC
Start: 2017-07-16 — End: 2017-07-15

## 2017-07-15 MED ORDER — HYDROCODONE-HOMATROPINE 5-1.5 MG/5ML PO SYRP
5.00 | ORAL_SOLUTION | ORAL | Status: DC
Start: ? — End: 2017-07-15

## 2017-07-15 MED ORDER — ALUM & MAG HYDROXIDE-SIMETH 200-200-20 MG/5ML PO SUSP
30.00 | ORAL | Status: DC
Start: ? — End: 2017-07-15

## 2017-07-15 MED ORDER — HEPARIN SODIUM (PORCINE) 5000 UNIT/ML IJ SOLN
5000.0000 [IU] | Freq: Three times a day (TID) | INTRAMUSCULAR | Status: DC
  Administered 2017-07-15 – 2017-07-16 (×2): 5000 [IU] via SUBCUTANEOUS
  Filled 2017-07-15 (×2): qty 1

## 2017-07-15 NOTE — H&P (Signed)
Chief Complaint:  8 cm hepatic abscess and lapband erosion  History of Present Illness:  Trevor Serrano is an 62 y.o. male [facility] following a workup for fever. The patient [facility] with [facility] and [facility].  He was on the hospitalist service and they got blood cultures which were positive for gram-positive cocci and got a CT scan that shows an 8 cm abscess of his liver.  Upper endoscopy today demonstrated erosion of his lap band.  He denies any pain or tenderness around his port.  He had his lap band performed in December 2011 by Dr. Daphine DeutscherMartin and Dr. Andrey CampanileWilson but was seen in the office only wants and he takes full responsibility for his noncompliance.  He has not been seen by us since then.  Over the last several weeks he has been not feeling as well and gradually feeling worse until he started having the fevers and some pain in the left upper quadrant.  Following his admission he was placed on Diflucan, Zosyn, and vancomycin.  He is admitted at this time for consideration for percutaneous drainage of his liver abscess with control of sepsis and subsequent removal of the lap band.  I discussed this with him and his friend and will consult interventional radiology.  Past Medical History:  Diagnosis Date  . Diabetes mellitus without complication   . Gout     Past Surgical History:  Procedure Laterality Date  . GASTRIC BYPASS      No current facility-administered medications for this encounter.    Patient has no known allergies. No family history on file. Social History:   reports that he has been smoking cigarettes.  He has been smoking about 1.00 pack per day. He does not have any smokeless tobacco history on file. He reports that he does not drink alcohol or use drugs.   REVIEW OF SYSTEMS : Negative except for see his problem list  Physical Exam:   Blood pressure 120/80, pulse 92, temperature  97.9 F (36.6 C), temperature source Oral, resp. rate 20, SpO2 93 %. There is no height or weight on file to calculate BMI.  Gen:  WD obese white male is thirsty and warm to touch Neurological: Alert and oriented to person, place, and time. Motor and sensory function is grossly intact  Head: Normocephalic and atraumatic.  Eyes: Conjunctivae are normal. Pupils are equal, round, and reactive to light. No scleral icterus.  Neck: Normal range of motion. Neck supple. No tracheal deviation or thyromegaly present.  Cardiovascular:  SR without murmurs or gallops.  No carotid bruits Breast: Not examined Respiratory: Effort normal.  No respiratory distress. No chest wall tenderness. Breath sounds normal.  No wheezes, rales or rhonchi.  Abdomen: Nontender, LAP-BAND port easily palpable on the right side. GU: Nontender Musculoskeletal: Normal range of motion. Extremities are nontender. No cyanosis, edema or clubbing noted Lymphadenopathy: No cervical, preauricular, postauricular or axillary adenopathy is present Skin: Skin is warm and dry. No rash noted. No diaphoresis. No erythema. No pallor. Pscyh: Normal mood and affect. Behavior is normal. Judgment and thought content normal.   LABORATORY RESULTS: No results found for this or any previous visit (from the past 48 hour(s)).   RADIOLOGY RESULTS: No results found.  Problem List: Patient Active Problem List   Diagnosis Date Noted  .  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 Hepatic abscess 07/15/2017    Assessment & Plan: History of laparoscopic  adjustable gastric banding, lap band APL placed in December 2011 without follow-up.  Apparent LAP-BAND erosion probably seeding of the liver with formation of liver abscess.  By history other causes of liver abscess including diverticulitis and appendicitis have been excluded.    Matt B. Daphine Deutscher, MD, [facility] [phone] beeper [phone]  07/15/2017 8:33 PM

## 2017-07-16 ENCOUNTER — Encounter (HOSPITAL_COMMUNITY): Payer: Self-pay | Admitting: Radiology

## 2017-07-16 ENCOUNTER — Inpatient Hospital Stay (HOSPITAL_COMMUNITY): Payer: BLUE CROSS/BLUE SHIELD

## 2017-07-16 DIAGNOSIS — I5030 Unspecified diastolic (congestive) heart failure: Secondary | ICD-10-CM

## 2017-07-16 DIAGNOSIS — A419 Sepsis, unspecified organism: Secondary | ICD-10-CM

## 2017-07-16 DIAGNOSIS — R7881 Bacteremia: Secondary | ICD-10-CM

## 2017-07-16 LAB — BASIC METABOLIC PANEL
Anion gap: 9 (ref 5–15)
BUN: 41 mg/dL — AB (ref 8–23)
CALCIUM: 8 mg/dL — AB (ref 8.9–10.3)
CHLORIDE: 105 mmol/L (ref 98–111)
CO2: 24 mmol/L (ref 22–32)
CREATININE: 1.94 mg/dL — AB (ref 0.61–1.24)
GFR calc Af Amer: 40 mL/min — ABNORMAL LOW (ref >60)
GFR, EST NON AFRICAN AMERICAN: 35 mL/min — AB (ref >60)
Glucose, Bld: 169 mg/dL — ABNORMAL HIGH (ref 70–99)
Potassium: 3.6 mmol/L (ref 3.5–5.1)
SODIUM: 138 mmol/L (ref 135–145)

## 2017-07-16 LAB — ECHOCARDIOGRAM COMPLETE
HEIGHTINCHES: 68 in
Weight: 5297.6 oz

## 2017-07-16 LAB — HEMOGLOBIN A1C
HEMOGLOBIN A1C: 6.6 % — AB (ref 4.8–5.6)
MEAN PLASMA GLUCOSE: 142.72 mg/dL

## 2017-07-16 LAB — CBC
HCT: 36.4 % — ABNORMAL LOW (ref 39.0–52.0)
Hemoglobin: 12.2 g/dL — ABNORMAL LOW (ref 13.0–17.0)
MCH: 30.7 pg (ref 26.0–34.0)
MCHC: 33.5 g/dL (ref 30.0–36.0)
MCV: 91.7 fL (ref 78.0–100.0)
PLATELETS: 385 10*3/uL (ref 150–400)
RBC: 3.97 MIL/uL — ABNORMAL LOW (ref 4.22–5.81)
RDW: 14.6 % (ref 11.5–15.5)
WBC: 17.1 10*3/uL — AB (ref 4.0–10.5)

## 2017-07-16 LAB — HIV ANTIBODY (ROUTINE TESTING W REFLEX): HIV Screen 4th Generation wRfx: NONREACTIVE

## 2017-07-16 LAB — BRAIN NATRIURETIC PEPTIDE: B Natriuretic Peptide: 146.3 pg/mL — ABNORMAL HIGH (ref 0.0–100.0)

## 2017-07-16 MED ORDER — SODIUM CHLORIDE 0.9 % IV SOLN
1500.0000 mg | Freq: Once | INTRAVENOUS | Status: AC
  Administered 2017-07-16: 1500 mg via INTRAVENOUS
  Filled 2017-07-16: qty 1500

## 2017-07-16 MED ORDER — GENERIC EXTERNAL MEDICATION
Status: DC
Start: ? — End: 2017-07-16

## 2017-07-16 MED ORDER — MIDAZOLAM HCL 2 MG/2ML IJ SOLN
INTRAMUSCULAR | Status: AC | PRN
  Administered 2017-07-16 (×2): 1 mg via INTRAVENOUS

## 2017-07-16 MED ORDER — FENTANYL CITRATE (PF) 100 MCG/2ML IJ SOLN
INTRAMUSCULAR | Status: AC
  Filled 2017-07-16: qty 2

## 2017-07-16 MED ORDER — IOPAMIDOL (ISOVUE-300) INJECTION 61%: 15.0000 mL | Freq: Once | INTRAVENOUS | Status: DC | PRN

## 2017-07-16 MED ORDER — HEPARIN SODIUM (PORCINE) 5000 UNIT/ML IJ SOLN: 5000.0000 [IU] | Freq: Three times a day (TID) | INTRAMUSCULAR | Status: DC

## 2017-07-16 MED ORDER — VANCOMYCIN HCL 10 G IV SOLR
1500.0000 mg | INTRAVENOUS | Status: DC
  Filled 2017-07-16: qty 1500

## 2017-07-16 MED ORDER — SODIUM CHLORIDE 0.9 % IV SOLN
INTRAVENOUS | Status: DC
  Administered 2017-07-16 – 2017-07-17 (×2): via INTRAVENOUS

## 2017-07-16 MED ORDER — LIDOCAINE HCL (PF) 1 % IJ SOLN
INTRAMUSCULAR | Status: AC | PRN
  Administered 2017-07-16: 15 mL

## 2017-07-16 MED ORDER — TAMSULOSIN HCL 0.4 MG PO CAPS
0.4000 mg | ORAL_CAPSULE | Freq: Every day | ORAL | Status: DC
  Administered 2017-07-16 – 2017-07-17 (×2): 0.4 mg via ORAL
  Filled 2017-07-16 (×2): qty 1

## 2017-07-16 MED ORDER — IOPAMIDOL (ISOVUE-300) INJECTION 61%
INTRAVENOUS | Status: AC
  Filled 2017-07-16: qty 100

## 2017-07-16 MED ORDER — AMLODIPINE BESYLATE 10 MG PO TABS
10.0000 mg | ORAL_TABLET | Freq: Every day | ORAL | Status: DC
  Administered 2017-07-16 – 2017-07-17 (×2): 10 mg via ORAL
  Filled 2017-07-16 (×3): qty 1

## 2017-07-16 MED ORDER — SODIUM CHLORIDE 0.9% FLUSH
5.0000 mL | Freq: Three times a day (TID) | INTRAVENOUS | Status: DC
  Administered 2017-07-16 (×2): 5 mL
  Administered 2017-07-17: 3 mL
  Administered 2017-07-17 – 2017-07-18 (×2): 5 mL

## 2017-07-16 MED ORDER — FENTANYL CITRATE (PF) 100 MCG/2ML IJ SOLN
INTRAMUSCULAR | Status: AC | PRN
  Administered 2017-07-16 (×2): 50 ug via INTRAVENOUS

## 2017-07-16 MED ORDER — MIDAZOLAM HCL 2 MG/2ML IJ SOLN
INTRAMUSCULAR | Status: AC
  Filled 2017-07-16: qty 2

## 2017-07-16 MED ORDER — IOHEXOL 300 MG/ML  SOLN
75.0000 mL | Freq: Once | INTRAMUSCULAR | Status: AC | PRN
  Administered 2017-07-16: 75 mL via INTRAVENOUS

## 2017-07-16 MED ORDER — IOPAMIDOL (ISOVUE-300) INJECTION 61%
INTRAVENOUS | Status: AC
  Filled 2017-07-16: qty 30

## 2017-07-16 NOTE — Consult Note (Signed)
Chief Complaint: Patient was seen in consultation today for CT-guided drainage of hepatic abscess  Referring Physician(s): Martin,M  Supervising Physician: Jolaine ClickHoss, Arthur  [facility]  History of Present Illness: Trevor Serrano is a 62 y.o. male, status post laparoscopic LAP-BAND placed on 01/10/2010, who recently [facility] with left upper quadrant pain, rigors, , fever, malaise and abdominal pain.  EGD showed circumferential black lap band had eroded through the wall of the stomach.  Recent CT shows abscess in left hepatic lobe measuring up to 8 cm.  The abscess abuts the tubing for the LAP-BAND just to the left of midline in the left upper abdomen.  Request now received for CT-guided drainage of the hepatic abscess.  Past Medical History:  Diagnosis Date  . Diabetes mellitus without complication   . Gout     Past Surgical History:  Procedure Laterality Date  . GASTRIC BYPASS      Allergies: Patient has no known allergies.  Medications: Prior to Admission medications   Medication Sig Start Date End Date Taking? Authorizing Provider  allopurinol (ZYLOPRIM) 100 MG tablet Take 100 mg by mouth 2 (two) times daily.    Yes [provider]  amLODipine (NORVASC) 10 MG tablet Take 10 mg by mouth daily.   Yes [provider]  aspirin EC 81 MG tablet Take 81 mg by mouth daily.   Yes [provider]  buPROPion (WELLBUTRIN) 100 MG tablet Take 100 mg by mouth 2 (two) times daily.    Yes [provider]  cetirizine (ZYRTEC) 10 MG tablet Take 10 mg by mouth daily.   Yes [provider]  esomeprazole (NEXIUM) 40 MG capsule Take 40 mg by mouth daily at 12 noon.   Yes [provider]  ferrous sulfate 325 (65 FE) MG tablet Take 325 mg by mouth daily with breakfast.   Yes [provider]  hydrochlorothiazide (MICROZIDE) 12.5 MG capsule Take 12.5 mg by mouth daily.   Yes [provider]  indomethacin (INDOCIN) 50 MG capsule Take 50 mg by mouth 2 (two) times daily as needed for mild pain or moderate pain.    Yes [provider]  metoprolol (LOPRESSOR) 50 MG tablet Take 50 mg by mouth daily.    Yes [provider]  Multiple Vitamin (MULTIVITAMIN WITH MINERALS) TABS tablet Take 1 tablet by mouth daily.   Yes [provider]  Omega-3 Fatty Acids (FISH OIL PO) Take 1,000 mg by mouth daily.   Yes [provider]  rosuvastatin (CRESTOR) 5 MG tablet Take 5 mg by mouth daily.   Yes [provider]  telmisartan (MICARDIS) 40 MG tablet Take 40 mg by mouth daily.   Yes [provider]  clindamycin (CLEOCIN) 300 MG capsule Take 1 capsule (300 mg total) by mouth 4 (four) times daily. X 10 days Patient not taking: Reported on 07/15/2017 11/10/13   Purvis SheffieldHarrison, Forrest, MD  indomethacin (INDOCIN) 25 MG capsule Take 1 capsule (25 mg total) by mouth 3 (three) times daily as needed. Patient not taking: Reported on 07/15/2017 11/10/13   Purvis SheffieldHarrison, Forrest, MD     No family history on file.  Social History   Socioeconomic History  . Marital status: Divorced    Spouse name: Not on file  . Number of children: Not on file  . Years of education: Not on file  . Highest education level: Not on file  Occupational History  . Not on file  Social Needs  . Financial resource strain: Not on file  . Food insecurity:    Worry: Not on file    Inability: Not on file  . Transportation needs:    Medical: Not on file    Non-medical: Not on file  Tobacco Use  . Smoking status: Current Every Day Smoker    Packs/day: 1.00    Types: Cigarettes  Substance and Sexual Activity  . Alcohol use: No  . Drug use: No  . Sexual activity: Never  Lifestyle  . Physical activity:    Days per week: Not on file    Minutes per session: Not on file  . Stress: Not on file  Relationships  . Social connections:    Talks on phone: Not on file    Gets together: Not on  file    Attends religious service: Not on file    Active member of club or organization: Not on file    Attends meetings of clubs or organizations: Not on file    Relationship status: Not on file  Other Topics Concern  . Not on file  Social History Narrative  . Not on file      Review of Systems see above: Currently denies headache, chest pain, worsening dyspnea, nausea, vomiting or bleeding.  Vital Signs: BP 138/89 (BP Location: Left Arm)   Pulse 64   Temp (!) 97.5 F (36.4 C) (Oral)   Resp (!) 22   Ht 5\' 8"  (1.727 m)   Wt (!) 331 lb 1.6 oz (150.2 kg)   SpO2 99%   BMI 50.34 kg/m   Physical Exam awake, alert.  Chest with slightly diminished breath sounds bases.  Heart with regular rate and rhythm.  Abdomen obese, soft, positive bowel sounds, currently nontender.  Extremities with full range of motion.  Imaging: Ct Abdomen Pelvis W Contrast  Addendum Date: 07/16/2017   ADDENDUM REPORT: 07/16/2017 14:21 ADDENDUM: Critical Value/emergent results were called by telephone at the time of interpretation on 07/16/2017 at 2:21 pm to Dr. Carolynne Edouard, General Surgery, who verbally acknowledged these results. Electronically Signed   By: Bretta Bang III M.D.   On: 07/16/2017 14:21   Result Date: 07/16/2017 CLINICAL DATA:  Fever with reported liver abscess EXAM: CT ABDOMEN AND PELVIS WITH CONTRAST TECHNIQUE: Multidetector CT imaging of the abdomen and pelvis was performed using the standard protocol following bolus administration of intravenous contrast. CONTRAST:  75mL OMNIPAQUE IOHEXOL 300 MG/ML  SOLN COMPARISON:  None. Reported recent [facility] from [facility] not available currently to compare. FINDINGS: Lower chest: There are small pleural effusions bilaterally with bibasilar atelectatic change. Hepatobiliary: Liver measures 22.4 cm in length. There is diffuse hepatic steatosis. There is a fluid collection involving much of the lateral segment right lobe of the liver measuring 8.2 x 8.0 x 5.0  cm consistent with abscess. There is no appreciable air in this area. Note that this abscess abuts a portion of the tubing from a lap band. Pancreas: No pancreatic mass or inflammatory focus. Spleen: No splenic lesions are evident. Spleen measures 14.0 x 13.8 x 11.3 cm with a measured splenic volume of 1,092 cubic cm. Adrenals/Urinary Tract: Adrenals appear unremarkable bilaterally. There is a cyst arising from the lateral mid left kidney measuring 4.0 x 3.3 cm. There is an 8 x 8 mm cyst in the lower pole of the right kidney laterally. There is no hydronephrosis on either side. There is no evident renal or ureteral calculus on either side. Urinary bladder is midline  with wall thickness within normal limits. There is a small urachal remnant without associated mass or inflammatory focus. Stomach/Bowel: There is a lap band positioned at the gastric cardia. There are foci of air outside of bowel lumen immediately adjacent to portions of the tubing of this lap band in the left upper quadrant. There is no appreciable bowel wall or mesenteric thickening. There is air along the wall of the stomach near the insertion for the lap band tubing suggesting erosion from the lap band region through the wall of the stomach. No other free air seen. No portal venous air evident. No evident bowel obstruction. There are scattered colonic diverticula without diverticulitis. Vascular/Lymphatic: There are foci of aortic and left common iliac artery atherosclerosis. No evident aneurysm. No free air or portal venous air. Reproductive: There are foci of prostatic calculi. Prostate and seminal vesicles appear normal in size and contour. No pelvic masses evident. Other: Appendix appears normal. Outside of the liver, there is no evident abscess in the abdomen or pelvis. There is no appreciable ascites in the abdomen or pelvis. There is a ventral hernia containing only fat. There is fat in each in inguinal ring. Musculoskeletal: There is  degenerative change in the lower thoracic and lumbar spine regions. There is anterior wedging of several lower thoracic vertebral bodies. There is no evident blastic or lytic bone lesion. There is no intramuscular lesion evident. IMPRESSION: 1. Abscess in the left lobe of the liver measuring 8.1 x 8.0 x 5.0 cm. Currently no air is appreciable within this abscess. This abscess abuts the tubing for the lap band just to the left of midline in the left upper abdomen. 2. Lap band position in gastric cardia region. There is air tracking along the edge of the lap band into the wall the stomach and along portions of the tubing outside of bowel lumen. There appears to be erosion of the lap band causing leakage of air from the upper stomach. The amount of free air seen is slight. 3. Splenomegaly. Liver is also enlarged. There is hepatic steatosis. 4. No abscess is seen apart from the liver abscess. No ascites evident. Appendix region appears normal. There are scattered colonic diverticula without diverticulitis. No evident bowel obstruction. 5.  No renal or ureteral calculus.  No hydronephrosis. 6.  There is aortoiliac atherosclerosis. 7. There is a ventral hernia containing only fat. There is fat in each inguinal ring. 8.  Small pleural effusions bilaterally with bibasilar atelectasis. Aortic Atherosclerosis (ICD10-I70.0). Electronically Signed: By: Bretta Bang III M.D. On: 07/16/2017 14:12   Dg Chest Port 1 View  Result Date: 07/16/2017 CLINICAL DATA:  Increase shortness of breath, hypoxia EXAM: PORTABLE CHEST 1 VIEW COMPARISON:  Chest x-ray of 05/22/2011 FINDINGS: The lungs are not as well aerated with probable mild volume loss at the bases left-greater-than-right. No definite pneumonia or pleural effusion is seen. Cardiomegaly is stable. No bony abnormality is noted. IMPRESSION: Diminished aeration with mild basilar volume loss. No definite active process. Stable cardiomegaly. Electronically Signed   By: Dwyane Dee M.D.   On: 07/16/2017 12:26    Labs:  CBC: Recent Labs    07/15/17 2143 07/16/17 0552  WBC 18.2* 17.1*  HGB 12.8* 12.2*  HCT 37.1* 36.4*  PLT 341 385    COAGS: Recent Labs    07/15/17 2143  INR 1.18  APTT 32    BMP: Recent Labs    07/15/17 2143 07/16/17 0552  NA 135 138  K 3.9 3.6  CL  105 105  CO2 19* 24  GLUCOSE 185* 169*  BUN 37* 41*  CALCIUM 7.9* 8.0*  CREATININE 1.97* 1.94*  GFRNONAA 34* 35*  GFRAA 40* 40*    LIVER FUNCTION TESTS: Recent Labs    07/15/17 2143  BILITOT 2.2*  AST 33  ALT 25  ALKPHOS 125  PROT 6.5  ALBUMIN 2.1*    TUMOR MARKERS: No results for input(s): AFPTM, CEA, CA199, CHROMGRNA in the last 8760 hours.  Assessment and Plan: 62 y.o. male, status post laparoscopic lap band placed on 01/10/2010, who recently presented to [facility] on 6/30 with left upper quadrant pain, rigors, , fever, malaise and abdominal pain.  EGD showed circumferential black lap band had eroded through the wall of the stomach.  Recent CT shows abscess in left hepatic lobe measuring up to 8 cm.  The abscess abuts the tubing for the lap band just to the left of midline in the left upper abdomen.  Request now received for CT-guided drainage of the hepatic abscess.  Imaging studies have been reviewed by Dr. Bonnielee Haff.Risks and benefits discussed with the patient including bleeding, infection, damage to adjacent structures, bowel perforation/fistula connection, and sepsis.  All of the patient's questions were answered, patient is agreeable to proceed. Consent signed and in chart.  Procedure scheduled for this afternoon.   Thank you for this interesting consult.  I greatly enjoyed meeting ROSE HIPPLER and look forward to participating in their care.  A copy of this report was sent to the requesting provider on this date.  Electronically Signed: D. Jeananne Rama, PA-C 07/16/2017, 3:20 PM   I spent a total of  25 minutes   in face to face in clinical  consultation, greater than 50% of which was counseling/coordinating care for CT-guided drainage of hepatic abscess

## 2017-07-16 NOTE — Procedures (Signed)
10 Fr left lobe liver abscess drain Pus EBL 0 Comp 0

## 2017-07-16 NOTE — Progress Notes (Signed)
CC:  Lap band erosion with liver abscess  Subjective: Pt presented to [facility] on 6/30 with LUQ pain, x 7 days, followed by shaking rigors, low grade fever, malaise, abdominal pain on 07/14/17. Pt had a laparoscopic lap band placed 01/10/2010 by Dr. Daphine DeutscherMartin.  Found to have a liver abscess/tumor.  EGD shows circumferential black lap band had eroded thru the wall of the stomach.   He has [facility] and [facility], currently just on Vancomycin/Zosyn. Active smoker before coming in 1PPD, some DOE, know to be prediabetic On O2 currently, foley in place for urinary retention.    Objective: Vital signs in last 24 hours: Temp:  [97.6 F (36.4 C)-97.9 F (36.6 C)] 97.6 F (36.4 C) (07/01 2338) Pulse Rate:  [69-92] 69 (07/02 0437) Resp:  [18-20] 20 (07/02 0437) BP: (108-120)/(78-86) 108/86 (07/02 0437) SpO2:  [93 %-96 %] 95 % (07/02 0437) Weight:  [150.2 kg (331 lb 1.6 oz)] 150.2 kg (331 lb 1.6 oz) (07/02 0555) Last BM Date: 07/13/17 Urine 600 recorded no other I/O Afebrile, VSS Glucose 185/169 BNP 146 Creatinine 1.94 WBC 17.1 CT ordered for here and IR to see for possible IR drain.  CT scan 07/14/17 @ [facility]:  Background fatty liver with a heterogeneous lesion left hepatic lobe measuring 8 x 5 cm. This is also seen on today's ultrasound. Concerning for phlegmon/abscess in a patient with fever/sepsis. Other etiologies including a hypoenhancing or necrotic mass in the differential. Doubt focal fat deposition. 2. Nonspecific perinephric stranding with a cyst in the left kidney. 3. Minimal left pleural effusion and adjacent atelectasis. Mild subsegmental atelectasis in the upper lobes. 4. Thoracic findings suggesting chronic Scheuermann's kyphosis.  Ultrasound abdomen 6/30 @ [facility]:  Heterogeneous 8 cm lesion in the left hepatic lobe as on today's CT. This may represent developing phlegmon/abscess or other mass. 2. Negative for gallstones or cholecystitis.         Intake/Output from previous day: 07/01 0701 - 07/02 0700 In: -  Out: 600 [Urine:600] Intake/Output this shift: No intake/output data recorded.  General appearance: alert, cooperative and no distress Resp: clear to auscultation bilaterally Cardio: regular rate and rhythm, S1, S2 normal, no murmur, click, rub or gallop GI: soft, non-tender; bowel sounds normal; no masses,  no organomegaly and no pain this AM Extremities: extremities normal, atraumatic, no cyanosis or edema and no edema, some venous stasis changes right great toe joint replacement.  Lab Results:  Recent Labs    07/15/17 2143 07/16/17 0552  WBC 18.2* 17.1*  HGB 12.8* 12.2*  HCT 37.1* 36.4*  PLT 341 385    BMET Recent Labs    07/15/17 2143 07/16/17 0552  NA 135 138  K 3.9 3.6  CL 105 105  CO2 19* 24  GLUCOSE 185* 169*  BUN 37* 41*  CREATININE 1.97* 1.94*  CALCIUM 7.9* 8.0*   PT/INR Recent Labs    07/15/17 2143  LABPROT 14.9  INR 1.18    Recent Labs  Lab 07/15/17 2143  AST 33  ALT 25  ALKPHOS 125  BILITOT 2.2*  PROT 6.5  ALBUMIN 2.1*     Lipase  No results found for: LIPASE   Prior to Admission medications   Medication Sig Start Date End Date Taking? Authorizing Provider  allopurinol (ZYLOPRIM) 100 MG tablet Take 100 mg by mouth 2 (two) times daily.    Yes [provider]  amLODipine (NORVASC) 10 MG tablet Take 10 mg by mouth daily.   Yes [provider]  aspirin EC 81 MG tablet Take 81 mg by mouth daily.   Yes [provider]  buPROPion (WELLBUTRIN) 100 MG tablet Take 100 mg by mouth 2 (two) times daily.    Yes [provider]  cetirizine (ZYRTEC) 10 MG tablet Take 10 mg by mouth daily.   Yes [provider]  esomeprazole (NEXIUM) 40 MG capsule Take 40 mg by mouth daily at 12 noon.   Yes [provider]  ferrous sulfate 325 (65 FE) MG tablet Take 325 mg by mouth daily with breakfast.   Yes [provider]   hydrochlorothiazide (MICROZIDE) 12.5 MG capsule Take 12.5 mg by mouth daily.   Yes [provider]  indomethacin (INDOCIN) 50 MG capsule Take 50 mg by mouth 2 (two) times daily as needed for mild pain or moderate pain.    Yes [provider]  metoprolol (LOPRESSOR) 50 MG tablet Take 50 mg by mouth daily.    Yes [provider]  Multiple Vitamin (MULTIVITAMIN WITH MINERALS) TABS tablet Take 1 tablet by mouth daily.   Yes [provider]  Omega-3 Fatty Acids (FISH OIL PO) Take 1,000 mg by mouth daily.   Yes [provider]  rosuvastatin (CRESTOR) 5 MG tablet Take 5 mg by mouth daily.   Yes [provider]  telmisartan (MICARDIS) 40 MG tablet Take 40 mg by mouth daily.   Yes [provider]  clindamycin (CLEOCIN) 300 MG capsule Take 1 capsule (300 mg total) by mouth 4 (four) times daily. X 10 days Patient not taking: Reported on 07/15/2017 11/10/13   Purvis Sheffield, MD  indomethacin (INDOCIN) 25 MG capsule Take 1 capsule (25 mg total) by mouth 3 (three) times daily as needed. Patient not taking: Reported on 07/15/2017 11/10/13   Purvis Sheffield, MD    Medications: . heparin  5,000 Units Subcutaneous Q8H  . metoprolol tartrate  50 mg Oral Daily   . dextrose 5% lactated ringers 125 mL/hr at 07/15/17 2203  . famotidine (PEPCID) IV Stopped (07/15/17 2242)  . piperacillin-tazobactam (ZOSYN)  IV 3.375 g (07/16/17 1610)  . [START ON 07/17/2017] vancomycin     Assessment/Plan Left pleural effusion CXR:  Cardiomegaly with pulmonary venous congestion Sleep apnea; does not use CPAP BMI 50.34  Prediabetes diagnosis - checking A1C Acute urinary retention - foley, will add Flomax later today Hypertension Acute kidney injury Tobacco abuse - ongoing GERD Gout  Sepsis with liver abscess 8 x 5 cm Blood culture = Gram positive cocci Gastric band erosion Gastric Lap band placement 2009 - no follow up  FEN:  NPO/IV fluids ID:   Vancomycin/Zosyn/fuconazole 07/15/17 =>> day 2 DVT:  Heparin/SCD's Follow up:  TBD Foley:  In place   Plan:  Continue antibiotics, ? Add Diflucan back. CT today with IR follow up.  Medicine consult for his other issues to help with care.  Dr. Daphine Deutscher told him and his ex-wife in the room with him that they would address removal of the lap band after the liver abscess was addressed.  He will need pre-op clearance for surgery.  He does not see a cardiologist just his PCP. Blood cultures are pending from here and [facility].   LOS: 1 day    Shrey Boike 07/16/2017 [phone]

## 2017-07-16 NOTE — Progress Notes (Signed)
Inpatient Diabetes Program Recommendations  AACE/ADA: New Consensus Statement on Inpatient Glycemic Control (2015)  Target Ranges:  Prepandial:   less than 140 mg/dL      Peak postprandial:   less than 180 mg/dL (1-2 hours)      Critically ill patients:  140 - 180 mg/dL   Results for Cheri RousGILLIE, Giordano F (MRN [id number]) as of 07/16/2017 12:31  Ref. Range 07/15/2017 21:43 07/16/2017 05:52  Glucose Latest Ref Range: 70 - 99 mg/dL 130185 (H) 865169 (H)   Results for Cheri RousGILLIE, Wing F (MRN [id number]) as of 07/16/2017 12:31  Ref. Range 07/16/2017 05:52  Hemoglobin A1C Latest Ref Range: 4.8 - 5.6 % 6.6 (H)    Admit with: 8 cm hepatic abscess and lapband erosion  History: DM, Gastric Bypass  Home DM Meds: None Listed  Current Insulin Orders: None      MD- Please consider placing orders for Novolog Sensitive Correction Scale/ SSI (0-9 units) Q4 hours      --Will follow patient during hospitalization--  Ambrose FinlandJeannine Johnston Quindon Denker RN, MSN, CDE Diabetes Coordinator Inpatient Glycemic Control Team Team Pager: [phone] (8a-5p)

## 2017-07-16 NOTE — Progress Notes (Signed)
Pharmacy Antibiotic Note  Trevor Serrano is a 62 y.o. male admitted on 07/15/2017 with sepsis.  Pharmacy has been consulted for Vancomycin and Zosyn dosing.  Plan: Zosyn 3.375g IV q8h (4 hour infusion).   Vancomycin 1gm iv x1, then 1500mg  iv q36hr  Goal AUC = 400 - 500 for all indications, except meningitis (goal AUC > 500 and Cmin 15-20 mcg/mL)   Height: 5\' 8"  (172.7 cm) Weight: (!) 331 lb 1.6 oz (150.2 kg) IBW/kg (Calculated) : 68.4  Temp (24hrs), Avg:97.8 F (36.6 C), Min:97.6 F (36.4 C), Max:97.9 F (36.6 C)  Recent Labs  Lab 07/15/17 2143  WBC 18.2*  CREATININE 1.97*    Estimated Creatinine Clearance: 54.2 mL/min (A) (by C-G formula based on SCr of 1.97 mg/dL (H)).    No Known Allergies  Antimicrobials this admission: Vancomycin 07/15/2017 >> Zosyn 07/15/2017 >>   Dose adjustments this admission: -  Microbiology results: -  Thank you for allowing pharmacy to be a part of this patient's care.  Aleene DavidsonGrimsley Jr, Rhoderick Farrel Crowford 07/16/2017 6:07 AM

## 2017-07-16 NOTE — Progress Notes (Signed)
Initial Nutrition Assessment  DOCUMENTATION CODES:   Morbid obesity  INTERVENTION:    30 ml Prostat BID, each supplement provides 100 kcals and 15 grams protein with clears   Provide Premier Protein BID, each supplement provides 160kcal and 30g protein once advanced  NUTRITION DIAGNOSIS:   Inadequate oral intake related to decreased appetite as evidenced by per patient/family report.  GOAL:   Patient will meet greater than or equal to 90% of their needs  MONITOR:   PO intake, Supplement acceptance, Diet advancement, Weight trends, I & O's, Labs  REASON FOR ASSESSMENT:   Malnutrition Screening Tool    ASSESSMENT:   Patient with PMH significant for DM, Gout, and s/p lap band 12/2009. [facility] with fever and upper abdominal pain 6/30. [facility] showed 8 cm abscess of his liver. Pt had upper endoscopy 7/2 that revealed erosion of his lap band. Admitted for percutaneous drainage of his liver and subsequent removal of his lap band.    Pt reports he noticed a decrease in PO intake over the last 3-4 weeks PTA. States he typically eats lunch/dinner and both meals consist of  fast food options like hamburgers, fries, and soda. During this time period he continued to get fast food daily but his portions decreased by 1/2. He is currently NPO for procedure. Discussed progression of diet s/p drain insertion and lap band removal. Pt amendable to supplementation.   Pt endorses a UBW of 315 lb and unsure of any recent wt loss. Records are limited in weight history but show pt weighing 348 lb in 2015 and 331 lb this admission. Nutrition-Focused physical exam completed.   Medications reviewed.  Labs reviewed.   NUTRITION - FOCUSED PHYSICAL EXAM:    Most Recent Value  Orbital Region  No depletion  Upper Arm Region  No depletion  Thoracic and Lumbar Region  No depletion  Buccal Region  No depletion  Temple Region  No depletion  Clavicle Bone Region  No depletion   Clavicle and Acromion Bone Region  No depletion  Scapular Bone Region  No depletion  Dorsal Hand  No depletion  Patellar Region  No depletion  Anterior Thigh Region  No depletion  Posterior Calf Region  No depletion  Edema (RD Assessment)  None     Diet Order:   Diet Order           Diet NPO time specified  Diet effective midnight          EDUCATION NEEDS:   Education needs have been addressed  Skin:  Skin Assessment: Reviewed RN Assessment  Last BM:  07/13/17  Height:   Ht Readings from Last 1 Encounters:  07/16/17 5\' 8"  (1.727 m)    Weight:   Wt Readings from Last 1 Encounters:  07/16/17 (!) 331 lb 1.6 oz (150.2 kg)    Ideal Body Weight:  70 kg  BMI:  Body mass index is 50.34 kg/m.  Estimated Nutritional Needs:   Kcal:  2270-2470 kcal (MSJ)  Protein:  115-125 g  Fluid:  >2.2 L/day    Vanessa Kickarly Carlen Fils RD, LDN Clinical Nutrition Pager # - [phone]

## 2017-07-16 NOTE — Progress Notes (Signed)
  Echocardiogram 2D Echocardiogram has been performed.  Trevor SkeenVijay  Ravon Serrano 07/16/2017, 2:38 PM

## 2017-07-16 NOTE — Consult Note (Signed)
Medical Consultation   Trevor Serrano  QMV:[id number]  DOB: October 13, 1955   DOA: 07/15/2017 PCP: Catha Gosselin, MD   Requesting physician: Dr Daphine Deutscher ([facility])  Reason for consultation: Management of chronic medical conditions   History of Present Illness: Trevor Serrano is an 62 y.o. 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  She was noted to be febrile with elevated white count, CT abdomen was performed which showed fatty liver with heterogeneous lesion in the left hepatic lobe measuring 8.5 cm, concerning for phlegmon/abscess.  Patient was empirically started on ertapenem and Flagyl, ID was consulted and switched to vancomycin, Zosyn, Flagyl and fluconazole was added.  Her underwent upper endoscopy that demonstrated erosion of his lap band.  Dr. Daphine Deutscher was consulted as initial procedure was performed by him and he accepted patient to be transfer to [facility].  Medical team has been going resulted to help with chronic conditions and bacteremia. 2/2 blood cx with gram + cocci.   Patient developed acute urinary retention in setting of sepsis and Foley catheter was placed.  Lab work-up significant for elevated WBC, elevated creatinine however no recent baseline labs.  During [facility] hospital stay patient was hypoxia and he was placed oxygen, however he denies shortness of breath and chest pain.  Review of Systems:  All reviewed and are negative  Past Medical History: Past Medical History:  Diagnosis Date  . Diabetes mellitus without complication   . Gout     Past Surgical History: Past Surgical History:  Procedure Laterality Date  . GASTRIC BYPASS      Allergies:  No Known Allergies   Social History:  reports that he has been smoking cigarettes.  He has been smoking about 1.00 pack per day. He does not  have any smokeless tobacco history on file. He reports that he does not drink alcohol or use drugs.   Family History: No family history on file.   Physical Exam: Vitals:   07/15/17 2338 07/16/17 0437 07/16/17 0555 07/16/17 0915  BP: 118/78 108/86  (!) 148/90  Pulse: 81 69  74  Resp: 18 20  20   Temp: 97.6 F (36.4 C)   97.6 F (36.4 C)  TempSrc: Oral   Oral  SpO2: 96% 95%  94%  Weight:   (!) 150.2 kg (331 lb 1.6 oz)   Height:   5\' 8"  (1.727 m)     Constitutional: NAD, sitting up chair Eyes: PERLA, EOMI,  ENMT: oropharynx mucosa, tongue, posterior pharynx appear normal  Neck: neck appears normal, no masses, normal ROM, no thyromegaly, no JVD  CVS: S1S2 clear, no murmur rubs or gallops, b/l trace LE edema, normal pedal pulses  Respiratory:  clear to auscultation bilaterally, no wheezing, rales or rhonchi. Respiratory effort normal.   Abdomen: Obese soft nontender, nondistended, normal bowel sounds, no hepatosplenomegaly Genitourinary: Foley in place, yellow urine  Musculoskeletal: : no cyanosis, clubbing or edema noted bilaterally Neuro: Cranial nerves II-XII intact, non focal  Psych: judgement and insight appear normal, stable mood and affect, mental status Skin: no rashes or lesions or ulcers, no induration or nodules   Data reviewed:  I have personally reviewed following labs and imaging studies Labs:  CBC: Recent Labs  Lab 07/15/17 2143 07/16/17 0552  WBC 18.2* 17.1*  NEUTROABS 16.6*  --  HGB 12.8* 12.2*  HCT 37.1* 36.4*  MCV 91.2 91.7  PLT 341 385    Basic Metabolic Panel: Recent Labs  Lab 07/15/17 2143 07/16/17 0552  NA 135 138  K 3.9 3.6  CL 105 105  CO2 19* 24  GLUCOSE 185* 169*  BUN 37* 41*  CREATININE 1.97* 1.94*  CALCIUM 7.9* 8.0*  MG 2.0  --   PHOS 4.3  --    GFR Estimated Creatinine Clearance: 55 mL/min (A) (by C-G formula based on SCr of 1.94 mg/dL (H)). Liver Function Tests: Recent Labs  Lab 07/15/17 2143  AST 33  ALT 25  ALKPHOS  125  BILITOT 2.2*  PROT 6.5  ALBUMIN 2.1*   No results for input(s): LIPASE, AMYLASE in the last 168 hours. No results for input(s): AMMONIA in the last 168 hours. Coagulation profile Recent Labs  Lab 07/15/17 2143  INR 1.18    Cardiac Enzymes: No results for input(s): CKTOTAL, CKMB, CKMBINDEX, TROPONINI in the last 168 hours. BNP: Invalid input(s): POCBNP CBG: No results for input(s): GLUCAP in the last 168 hours. D-Dimer No results for input(s): DDIMER in the last 72 hours. Hgb A1c No results for input(s): HGBA1C in the last 72 hours. Lipid Profile No results for input(s): CHOL, HDL, LDLCALC, TRIG, CHOLHDL, LDLDIRECT in the last 72 hours. Thyroid function studies Recent Labs    07/15/17 2143  TSH 0.720   Anemia work up No results for input(s): VITAMINB12, FOLATE, FERRITIN, TIBC, IRON, RETICCTPCT in the last 72 hours. Urinalysis No results found for: COLORURINE, APPEARANCEUR, LABSPEC, PHURINE, GLUCOSEU, HGBUR, BILIRUBINUR, KETONESUR, PROTEINUR, UROBILINOGEN, NITRITE, LEUKOCYTESUR   Microbiology No results found for this or any previous visit (from the past 240 hour(s)).    Inpatient Medications:   Scheduled Meds: . [START ON 07/17/2017] heparin  5,000 Units Subcutaneous Q8H  . iopamidol      . metoprolol tartrate  50 mg Oral Daily  . tamsulosin  0.4 mg Oral QPC supper   Continuous Infusions: . sodium chloride 100 mL/hr at 07/16/17 0930  . famotidine (PEPCID) IV 20 mg (07/16/17 0928)  . piperacillin-tazobactam (ZOSYN)  IV 3.375 g (07/16/17 [id number])  . [START ON 07/17/2017] vancomycin       Radiological Exams on Admission: No results found.  Impression/Recommendations Sepsis due to bacteremia and ? Hepatic abscess 8x5 2/2 blood cultures showed gram + cocci, patient currently on Zosyn and Vanc. Poor renal function but unclear if this is chronic will need to de-escalate vanc as soon as possible. For now await final blood cultures from [facility] I have check care  everywhere and the is not final identification yet, so continue Zosyn and Vancomycin, no need for Diflucan. Patient will need abscess to be drain and sent for cultures. Consider ID consult.  Hepatic abscess  Per surgical team  See above, IR planning for drainage   Gastric band erosion  Management surgical team   Hypoxia  Patient was placed on oxygen due to apparent desat, [facility], CXR show cardiomegaly and increase in vascular congestion. Patient asymptomatic, denies SOB and chest pain. Does not seem fluid overloaded however unreliable exam due to body habitus. BNP 146. Will get CXR and check an ECHO given hx of cardiomegaly. Wean O2 as able, he has been at [facility] with good sats during this hospital stay. No need for lasix at this time. Continue to monitor.   Acute urinary retention  In setting of sepsis, likely enlarge prostate as well, patient  report dribbling. Start Flomax, remove foley, voiding trial. If no micturition, obtain bladder scan, if > 400 re insert foley and get urology consult.    HTN  BP above goal, Resume Norvasc, continue metoprolol Cont hydralazine PRN for SBP > 180 EKG normal SR.  Monitor BP   AKI Unclear if this is acute of have a chronic component, no baseline available  Cr 6/30 at [facility] was 1.8. Current Cr 1.9 with GFR of 35 Continue IVF for now but monitor for sings of fluid overload. If patient becomes SOB or hypoxic give Lasix. Will get a renal US  Check BMP in AM   Medical clearance. Patient is medically optimized to have a surgical intervention, if ECHO does not show significant abnormalities, ok to proceed with surgery.    Thank you for this consultation.  Our [facility] hospitalist team will follow the patient with you.  Time Spent: 110 minutes  Latrelle Dodrill M.D. [facility]  07/16/2017, 9:53 AM  Pager please text page via  [email or web address] Password [email or web address]   Note - This record has been created using [employer]. Chart creation errors have been sought, but may not always have been located. Such creation errors do not reflect on the standard of medical care.

## 2017-07-17 ENCOUNTER — Inpatient Hospital Stay: Payer: Self-pay

## 2017-07-17 DIAGNOSIS — I10 Essential (primary) hypertension: Secondary | ICD-10-CM

## 2017-07-17 DIAGNOSIS — R160 Hepatomegaly, not elsewhere classified: Secondary | ICD-10-CM

## 2017-07-17 DIAGNOSIS — Z978 Presence of other specified devices: Secondary | ICD-10-CM

## 2017-07-17 DIAGNOSIS — N179 Acute kidney failure, unspecified: Secondary | ICD-10-CM

## 2017-07-17 DIAGNOSIS — F17210 Nicotine dependence, cigarettes, uncomplicated: Secondary | ICD-10-CM

## 2017-07-17 DIAGNOSIS — Z6841 Body Mass Index (BMI) 40.0 and over, adult: Secondary | ICD-10-CM

## 2017-07-17 DIAGNOSIS — K750 Abscess of liver: Secondary | ICD-10-CM

## 2017-07-17 DIAGNOSIS — Z9884 Bariatric surgery status: Secondary | ICD-10-CM

## 2017-07-17 LAB — CBC
HCT: 39.3 % (ref 39.0–52.0)
Hemoglobin: 13 g/dL (ref 13.0–17.0)
MCH: 30.3 pg (ref 26.0–34.0)
MCHC: 33.1 g/dL (ref 30.0–36.0)
MCV: 91.6 fL (ref 78.0–100.0)
PLATELETS: 419 10*3/uL — AB (ref 150–400)
RBC: 4.29 MIL/uL (ref 4.22–5.81)
RDW: 15 % (ref 11.5–15.5)
WBC: 18.3 10*3/uL — ABNORMAL HIGH (ref 4.0–10.5)

## 2017-07-17 LAB — BASIC METABOLIC PANEL
Anion gap: 9 (ref 5–15)
BUN: 47 mg/dL — AB (ref 8–23)
CO2: 24 mmol/L (ref 22–32)
CREATININE: 1.87 mg/dL — AB (ref 0.61–1.24)
Calcium: 8.2 mg/dL — ABNORMAL LOW (ref 8.9–10.3)
Chloride: 105 mmol/L (ref 98–111)
GFR calc Af Amer: 42 mL/min — ABNORMAL LOW (ref >60)
GFR, EST NON AFRICAN AMERICAN: 36 mL/min — AB (ref >60)
GLUCOSE: 124 mg/dL — AB (ref 70–99)
Potassium: 4.2 mmol/L (ref 3.5–5.1)
Sodium: 138 mmol/L (ref 135–145)

## 2017-07-17 LAB — HEMOGLOBIN A1C
Hgb A1c MFr Bld: 6.5 % — ABNORMAL HIGH (ref 4.8–5.6)
Mean Plasma Glucose: 139.85 mg/dL

## 2017-07-17 MED ORDER — GENERIC EXTERNAL MEDICATION
Status: DC
Start: ? — End: 2017-07-17

## 2017-07-17 MED ORDER — ALLOPURINOL 100 MG PO TABS
100.0000 mg | ORAL_TABLET | Freq: Two times a day (BID) | ORAL | Status: DC
  Administered 2017-07-17 – 2017-07-18 (×3): 100 mg via ORAL
  Filled 2017-07-17 (×3): qty 1

## 2017-07-17 MED ORDER — PANTOPRAZOLE SODIUM 40 MG PO TBEC
40.0000 mg | DELAYED_RELEASE_TABLET | Freq: Two times a day (BID) | ORAL | Status: DC
  Administered 2017-07-17 – 2017-07-18 (×3): 40 mg via ORAL
  Filled 2017-07-17 (×3): qty 1

## 2017-07-17 MED ORDER — SODIUM CHLORIDE 0.9 % IV SOLN
2.0000 g | INTRAVENOUS | Status: DC
  Administered 2017-07-17: 2 g via INTRAVENOUS
  Filled 2017-07-17 (×2): qty 20

## 2017-07-17 MED ORDER — BUPROPION HCL 100 MG PO TABS
100.0000 mg | ORAL_TABLET | Freq: Two times a day (BID) | ORAL | Status: DC
  Administered 2017-07-17 – 2017-07-18 (×3): 100 mg via ORAL
  Filled 2017-07-17 (×3): qty 1

## 2017-07-17 MED ORDER — ENOXAPARIN SODIUM 80 MG/0.8ML ~~LOC~~ SOLN
0.5000 mg/kg | SUBCUTANEOUS | Status: DC
  Administered 2017-07-17: 75 mg via SUBCUTANEOUS
  Filled 2017-07-17: qty 0.8

## 2017-07-17 NOTE — Progress Notes (Signed)
PHARMACY CONSULT NOTE FOR:  OUTPATIENT  PARENTERAL ANTIBIOTIC THERAPY (OPAT)  Indication: Liver abscess Regimen: CTX 2 g IV daily End date: 08/04/17  IV antibiotic discharge orders are pended. To discharging provider:  please sign these orders via discharge navigator,  Select New Orders & click on the button choice - Manage This Unsigned Work.     Thank you for allowing pharmacy to be a part of this patient's care.  Cindi CarbonMary M Arvon Schreiner, PharmD, BCPS Clinical Pharmacist 07/17/2017, 3:01 PM

## 2017-07-17 NOTE — Consult Note (Addendum)
[name]    Date of Admission:  07/15/2017   Total days of antibiotics: 4               Reason for Consult: Liver abscess    Referring Provider: Creig Hines   Assessment: Liver Abscess Eroded lap bond AKI Morbid obesity Hyperglycemia   Plan: 1. Would place pic 2. Stop vanco 3. Chang zosyn to ceftriaxone 2g qday for 3 weeks 4. Check A1C 5. Check hepatitis C ab 6. Repeat CT in 2-3 weeks 7. F/u with IR re: drain removal 8. Surgical f/u of eroded band 9. Follow Cr 10. 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 allopurinol  100 mg Oral BID  . amLODipine  10 mg Oral Daily  . buPROPion  100 mg Oral BID  . enoxaparin (LOVENOX) injection  0.5 mg/kg Subcutaneous Q24H  . metoprolol tartrate  50 mg Oral Daily  . pantoprazole  40 mg Oral BID  . sodium chloride flush  5 mL Intracatheter Q8H  . tamsulosin  0.4 mg Oral QPC supper    HPI: Trevor Serrano is a 62 y.o. male with hx of liver abscess dx at [facility] on 6-30 after he presented with 7 days of LUQ pain, fever, rigors, and malaise. He previously had a lap band (placed 2011 with 80+ pound wt loss) which had eroded through his stomach wall which was felt to be contributing to his abscess.  He was seen by ID there and started on flagyl/vanco/zosyn/fluconazole. His BCx there BCID "streptococcus" without further ID.   His course was complicated by urinary retention as well as AKI ( Cr 1.9 on adm).  He was transferred here on 7-1.  I discussed his antibiotics with the hospitalist team on 7-2 (narrowed to vanco/zosyn from previous). He underwent IR drain on 7-2 (10 cc of purulence).   Review of Systems: Review of Systems  Constitutional: Negative for chills and fever.    Gastrointestinal: Negative for abdominal pain, constipation, diarrhea, nausea and vomiting.  Genitourinary: Negative for dysuria.  Please see HPI. All other systems reviewed and negative.   Past Medical History:  Diagnosis Date  . Diabetes mellitus without complication ([place])   . Gout     Social History   Tobacco Use  . Smoking status: Current Every Day Smoker    Packs/day: 1.00    Types: Cigarettes  Substance Use Topics  . Alcohol use: No  . Drug use: No    No family history on file.   Medications:  Scheduled: . allopurinol  100 mg Oral BID  . amLODipine  10 mg Oral Daily  . buPROPion  100 mg Oral BID  . enoxaparin (LOVENOX) injection  0.5 mg/kg Subcutaneous Q24H  . metoprolol tartrate  50 mg Oral Daily  . pantoprazole  40 mg Oral BID  . sodium chloride flush  5 mL Intracatheter Q8H  . tamsulosin  0.4 mg Oral QPC supper    Abtx:  Anti-infectives (From admission, onward)   Start     Dose/Rate Route Frequency Ordered Stop   07/17/17 1800  vancomycin (VANCOCIN) 1,500 mg in sodium chloride 0.9 % 500 mL IVPB  1,500 mg 250 mL/hr over 120 Minutes Intravenous Every 36 hours 07/16/17 0605     07/16/17 0615  vancomycin (VANCOCIN) 1,500 mg in sodium chloride 0.9 % 500 mL IVPB     1,500 mg 250 mL/hr over 120 Minutes Intravenous  Once 07/16/17 0605 07/16/17 1058   07/15/17 2200  piperacillin-tazobactam (ZOSYN) IVPB 3.375 g     3.375 g 12.5 mL/hr over 240 Minutes Intravenous Every 8 hours 07/15/17 2128     07/15/17 2200  vancomycin (VANCOCIN) IVPB 1000 mg/200 mL premix     1,000 mg 200 mL/hr over 60 Minutes Intravenous  Once 07/15/17 2128 07/15/17 2345        OBJECTIVE: Blood pressure 138/84, pulse (!) 57, temperature (!) 97.4 F (36.3 C), temperature source Oral, resp. rate 18, height '5\' 8"'  (1.727 m), weight (!) 150.2 kg (331 lb 1.6 oz), SpO2 97 %.  Physical Exam  Constitutional: He appears well-developed and well-nourished. No distress.  HENT:  Mouth/Throat:  No oropharyngeal exudate.  Eyes: Pupils are equal, round, and reactive to light. Conjunctivae and EOM are normal.  Neck: Normal range of motion. Neck supple.  Cardiovascular: Normal rate, regular rhythm and normal heart sounds.  Pulmonary/Chest: Breath sounds normal.  Abdominal: Soft. Bowel sounds are normal. He exhibits no mass.    Musculoskeletal: He exhibits no edema.  Lymphadenopathy:    He has no cervical adenopathy.  Psychiatric: He has a normal mood and affect.    Lab Results Results for orders placed or performed during the hospital encounter of 07/15/17 (from the past 48 hour(s))  Culture, blood (routine x 2)     Status: None (Preliminary result)   Collection Time: 07/15/17  9:42 PM  Result Value Ref Range   Specimen Description BLOOD LEFT HAND    Special Requests      BOTTLES DRAWN AEROBIC AND ANAEROBIC Blood Culture adequate volume Performed at [facility] [address], [place], [place] [address]    Culture      NO GROWTH 2 DAYS Performed at [facility], [place] [address] [place], [place] [address]    Report Status PENDING   HIV antibody (Routine Testing)     Status: None   Collection Time: 07/15/17  9:43 PM  Result Value Ref Range   HIV Screen 4th Generation wRfx Non Reactive Non Reactive    Comment: (NOTE) Performed At: [facility] [place], [place] [id number] Rush Farmer MD AT:[id number] Performed at [facility], [place] [address], [place], [place] [address]   Comprehensive metabolic panel     Status: Abnormal   Collection Time: 07/15/17  9:43 PM  Result Value Ref Range   Sodium 135 135 - 145 mmol/L   Potassium 3.9 3.5 - 5.1 mmol/L   Chloride 105 98 - 111 mmol/L    Comment: Please note change in reference range.   CO2 19 (L) 22 - 32 mmol/L   Glucose, Bld 185 (H) 70 - 99 mg/dL    Comment: Please note change in reference range.   BUN 37 (H) 8 - 23 mg/dL    Comment: Please note change  in reference range.   Creatinine, Ser 1.97 (H) 0.61 - 1.24 mg/dL   Calcium 7.9 (L) 8.9 - 10.3 mg/dL   Total Protein 6.5 6.5 - 8.1 g/dL   Albumin 2.1 (L) 3.5 - 5.0 g/dL   AST 33 15 - 41 U/L   ALT 25 0 - 44 U/L    Comment:  Please note change in reference range.   Alkaline Phosphatase 125 38 - 126 U/L   Total Bilirubin 2.2 (H) 0.3 - 1.2 mg/dL   GFR calc non Af Amer 34 (L) >60 mL/min   GFR calc Af Amer 40 (L) >60 mL/min    Comment: (NOTE) The eGFR has been calculated using the CKD EPI equation. This calculation has not been validated in all clinical situations. eGFR's persistently <60 mL/min signify possible Chronic Kidney Disease.    Anion gap 11 5 - 15    Comment: Performed at [facility], [place] [address], [place], [place] [address]  Brain natriuretic peptide     Status: Abnormal   Collection Time: 07/15/17  9:43 PM  Result Value Ref Range   B Natriuretic Peptide 146.3 (H) 0.0 - 100.0 pg/mL    Comment: Performed at [facility], [place] [address] [place], [place] [address]  TSH     Status: None   Collection Time: 07/15/17  9:43 PM  Result Value Ref Range   TSH 0.720 0.350 - 4.500 uIU/mL    Comment: Performed by a 3rd Generation assay with a functional sensitivity of <=0.01 uIU/mL. Performed at [facility], [place] [address], [place], [place] [address]   Magnesium     Status: None   Collection Time: 07/15/17  9:43 PM  Result Value Ref Range   Magnesium 2.0 1.7 - 2.4 mg/dL    Comment: Performed at [facility] [place] [address], [place], [place] [address]  Phosphorus     Status: None   Collection Time: 07/15/17  9:43 PM  Result Value Ref Range   Phosphorus 4.3 2.5 - 4.6 mg/dL    Comment: Performed at  [facility], [place] [address] [place] [address], [place], [place] [address]  Protime-INR     Status: None   Collection Time: 07/15/17  9:43 PM  Result Value Ref Range   Prothrombin Time 14.9 11.4 - 15.2 seconds    INR 1.18     Comment: Performed at [facility], [place] [address], [place], [place] [address]  APTT     Status: None   Collection Time: 07/15/17  9:43 PM  Result Value Ref Range   aPTT 32 24 - 36 seconds    Comment: Performed at [facility], [place] [address], [place], [place] [address]  CBC WITH DIFFERENTIAL     Status: Abnormal   Collection Time: 07/15/17  9:43 PM  Result Value Ref Range   WBC 18.2 (H) 4.0 - 10.5 K/uL   RBC 4.07 (L) 4.22 - 5.81 MIL/uL   Hemoglobin 12.8 (L) 13.0 - 17.0 g/dL   HCT 37.1 (L) 39.0 - 52.0 %   MCV 91.2 78.0 - 100.0 fL   MCH 31.4 26.0 - 34.0 pg   MCHC 34.5 30.0 - 36.0 g/dL   RDW 14.5 11.5 - 15.5 %   Platelets 341 150 - 400 K/uL   Neutrophils Relative % 92 %   Neutro Abs 16.6 (H) 1.7 - 7.7 K/uL   Lymphocytes Relative 5 %   Lymphs Abs 0.9 0.7 - 4.0 K/uL   Monocytes Relative 3 %   Monocytes Absolute 0.6 0.1 - 1.0 K/uL   Eosinophils Relative 0 %   Eosinophils Absolute 0.0 0.0 - 0.7 K/uL   Basophils Relative 0 %   Basophils Absolute 0.0 0.0 - 0.1 K/uL    Comment: Performed at [facility], [place] [address] [place], [place]  [address]  Culture, blood (routine x 2)     Status: None (Preliminary result)   Collection Time: 07/15/17  9:43 PM  Result Value Ref Range   Specimen Description BLOOD RIGHT HAND    Special Requests      BOTTLES DRAWN AEROBIC AND ANAEROBIC Blood Culture adequate volume Performed at [facility] [address], [place], [place] [address]    Culture      NO GROWTH 2 DAYS Performed at [facility], [place] [address], [place], [place] [address]    Report Status PENDING   Basic metabolic panel     Status: Abnormal   Collection Time: 07/16/17  5:52 AM  Result Value Ref Range   Sodium 138 135 - 145 mmol/L   Potassium 3.6 3.5 - 5.1 mmol/L   Chloride 105 98 - 111 mmol/L    Comment: Please note change in reference range.   CO2 24 22 - 32 mmol/L   Glucose,  Bld 169 (H) 70 - 99 mg/dL    Comment: Please note change in reference range.   BUN 41 (H) 8 - 23 mg/dL    Comment: Please note change in reference range.   Creatinine, Ser 1.94 (H) 0.61 - 1.24 mg/dL   Calcium 8.0 (L) 8.9 - 10.3 mg/dL   GFR calc non Af Amer 35 (L) >60 mL/min   GFR calc Af Amer 40 (L) >60 mL/min    Comment: (NOTE) The eGFR has been calculated using the CKD EPI equation. This calculation has not been validated in all clinical situations. eGFR's persistently <60 mL/min signify possible Chronic Kidney Disease.    Anion gap 9 5 - 15    Comment: Performed at [facility], [place] [address] [place], [place] [address]  CBC     Status: Abnormal   Collection Time: 07/16/17  5:52 AM  Result Value Ref Range   WBC 17.1 (H) 4.0 - 10.5 K/uL   RBC 3.97 (L) 4.22 - 5.81 MIL/uL   Hemoglobin 12.2 (L) 13.0 - 17.0 g/dL   HCT 36.4 (L) 39.0 - 52.0 %   MCV 91.7 78.0 - 100.0 fL   MCH 30.7 26.0 - 34.0 pg   MCHC 33.5 30.0 - 36.0 g/dL   RDW 14.6 11.5 - 15.5 %   Platelets 385 150 - 400 K/uL    Comment: Performed at [facility], [place] [address] [place], [place] [address]  Hemoglobin A1c     Status: Abnormal   Collection Time: 07/16/17  5:52 AM  Result Value Ref Range   Hgb A1c MFr Bld 6.6 (H) 4.8 - 5.6 %    Comment: (NOTE) Pre diabetes:          5.7%-6.4% Diabetes:              >6.4% Glycemic control for   <7.0% adults with diabetes    Mean Plasma Glucose 142.72 mg/dL    Comment: Performed at [facility] [address], [place], [place] [address]  Aerobic/Anaerobic Culture (surgical/deep wound)     Status: None (Preliminary result)   Collection Time: 07/16/17  5:06 PM  Result Value Ref Range   Specimen Description      ABSCESS LIVER Performed at [facility], [place] [address], [place], [place] [address]    Special Requests      NONE Performed at [facility], [place] [address] [place], [place] [address]    Gram  Stain  ABUNDANT WBC PRESENT, PREDOMINANTLY PMN ABUNDANT GRAM POSITIVE COCCI    Culture      NO GROWTH < 24 HOURS Performed at [facility] [address], [place], [place] [address]    Report Status PENDING   CBC     Status: Abnormal   Collection Time: 07/17/17  5:34 AM  Result Value Ref Range   WBC 18.3 (H) 4.0 - 10.5 K/uL   RBC 4.29 4.22 - 5.81 MIL/uL   Hemoglobin 13.0 13.0 - 17.0 g/dL   HCT 39.3 39.0 - 52.0 %   MCV 91.6 78.0 - 100.0 fL   MCH 30.3 26.0 - 34.0 pg   MCHC 33.1 30.0 - 36.0 g/dL   RDW 15.0 11.5 - 15.5 %   Platelets 419 (H) 150 - 400 K/uL    Comment: Performed at [facility], [place] [address], [place], [place] [address]  Basic metabolic panel     Status: Abnormal   Collection Time: 07/17/17  5:34 AM  Result Value Ref Range   Sodium 138 135 - 145 mmol/L   Potassium 4.2 3.5 - 5.1 mmol/L   Chloride 105 98 - 111 mmol/L    Comment: Please note change in reference range.   CO2 24 22 - 32 mmol/L   Glucose, Bld 124 (H) 70 - 99 mg/dL    Comment: Please note change in reference range.   BUN 47 (H) 8 - 23 mg/dL    Comment: Please note change in reference range.   Creatinine, Ser 1.87 (H) 0.61 - 1.24 mg/dL   Calcium 8.2 (L) 8.9 - 10.3 mg/dL   GFR calc non Af Amer 36 (L) >60 mL/min   GFR calc Af Amer 42 (L) >60 mL/min    Comment: (NOTE) The eGFR has been calculated using the CKD EPI equation. This calculation has not been validated in all clinical situations. eGFR's persistently <60 mL/min signify possible Chronic Kidney Disease.    Anion gap 9 5 - 15    Comment: Performed at [facility], [place] [address] [place], [place] [address]      Component Value Date/Time   SDES  07/16/2017 1706    ABSCESS LIVER Performed at [facility], [place] [address] [place], [place] [address]    SPECREQUEST  07/16/2017 1706    NONE Performed at [facility], [place] [address], [place], [place] [address]    CULT   07/16/2017 1706    NO GROWTH < 24 HOURS Performed at [facility] [address], [place], [place] [address]    REPTSTATUS PENDING 07/16/2017 1706   Ct Abdomen Pelvis W Contrast  Addendum Date: 07/16/2017   ADDENDUM REPORT: 07/16/2017 14:21 ADDENDUM: Critical Value/emergent results were called by telephone at the time of interpretation on 07/16/2017 at 2:21 pm to Dr. Marlou Starks, General Surgery, who verbally acknowledged these results. Electronically Signed   By: Lowella Grip III M.D.   On: 07/16/2017 14:21   Result Date: 07/16/2017 CLINICAL DATA:  Fever with reported liver abscess EXAM: CT ABDOMEN AND PELVIS WITH CONTRAST TECHNIQUE: Multidetector CT imaging of the abdomen and pelvis was performed using the standard protocol following bolus administration of intravenous contrast. CONTRAST:  45m OMNIPAQUE IOHEXOL 300 MG/ML  SOLN COMPARISON:  None. [facility]. FINDINGS: Lower chest: There are small pleural effusions bilaterally with bibasilar atelectatic change. Hepatobiliary: Liver measures 22.4 cm in length. There is diffuse hepatic steatosis.  There is a fluid collection involving much of the lateral segment right lobe of the liver measuring 8.2 x 8.0 x 5.0 cm consistent with abscess. There is no appreciable air in this area. Note that this abscess abuts a portion of the tubing from a lap band. Pancreas: No pancreatic mass or inflammatory focus. Spleen: No splenic lesions are evident. Spleen measures 14.0 x 13.8 x 11.3 cm with a measured splenic volume of 1,092 cubic cm. Adrenals/Urinary Tract: Adrenals appear unremarkable bilaterally. There is a cyst arising from the lateral mid left kidney measuring 4.0 x 3.3 cm. There is an 8 x 8 mm cyst in the lower pole of the right kidney laterally. There is no hydronephrosis on either side. There is no evident renal or ureteral calculus on either side. Urinary bladder is midline with wall  thickness within normal limits. There is a small urachal remnant without associated mass or inflammatory focus. Stomach/Bowel: There is a lap band positioned at the gastric cardia. There are foci of air outside of bowel lumen immediately adjacent to portions of the tubing of this lap band in the left upper quadrant. There is no appreciable bowel wall or mesenteric thickening. There is air along the wall of the stomach near the insertion for the lap band tubing suggesting erosion from the lap band region through the wall of the stomach. No other free air seen. No portal venous air evident. No evident bowel obstruction. There are scattered colonic diverticula without diverticulitis. Vascular/Lymphatic: There are foci of aortic and left common iliac artery atherosclerosis. No evident aneurysm. No free air or portal venous air. Reproductive: There are foci of prostatic calculi. Prostate and seminal vesicles appear normal in size and contour. No pelvic masses evident. Other: Appendix appears normal. Outside of the liver, there is no evident abscess in the abdomen or pelvis. There is no appreciable ascites in the abdomen or pelvis. There is a ventral hernia containing only fat. There is fat in each in inguinal ring. Musculoskeletal: There is degenerative change in the lower thoracic and lumbar spine regions. There is anterior wedging of several lower thoracic vertebral bodies. There is no evident blastic or lytic bone lesion. There is no intramuscular lesion evident. IMPRESSION: 1. Abscess in the left lobe of the liver measuring 8.1 x 8.0 x 5.0 cm. Currently no air is appreciable within this abscess. This abscess abuts the tubing for the lap band just to the left of midline in the left upper abdomen. 2. Lap band position in gastric cardia region. There is air tracking along the edge of the lap band into the wall the stomach and along portions of the tubing outside of bowel lumen. There appears to be erosion of the lap  band causing leakage of air from the upper stomach. The amount of free air seen is slight. 3. Splenomegaly. Liver is also enlarged. There is hepatic steatosis. 4. No abscess is seen apart from the liver abscess. No ascites evident. Appendix region appears normal. There are scattered colonic diverticula without diverticulitis. No evident bowel obstruction. 5.  No renal or ureteral calculus.  No hydronephrosis. 6.  There is aortoiliac atherosclerosis. 7. There is a ventral hernia containing only fat. There is fat in each inguinal ring. 8.  Small pleural effusions bilaterally with bibasilar atelectasis. Aortic Atherosclerosis (ICD10-I70.0). Electronically Signed: By: Lowella Grip III M.D. On: 07/16/2017 14:12   US Renal  Result Date: 07/16/2017 CLINICAL DATA:  62 year old male with acute kidney insufficiency. Subsequent encounter. EXAM: RENAL / URINARY  TRACT ULTRASOUND COMPLETE COMPARISON:  07/16/2017 CT. FINDINGS: Right Kidney: Length: 12.7 cm. Echogenicity within normal limits. No mass or hydronephrosis visualized. Left Kidney: Length: 12.8 cm. Echogenicity within normal limits. No hydronephrosis. 3.3 x 3.8 x 3.2 cm lower pole cyst. Bladder: Appears normal for degree of bladder distention. IMPRESSION: No hydronephrosis. 3.8 cm left renal cyst. Electronically Signed   By: Genia Del M.D.   On: 07/16/2017 17:28   Dg Chest Port 1 View  Result Date: 07/16/2017 CLINICAL DATA:  Increase shortness of breath, hypoxia EXAM: PORTABLE CHEST 1 VIEW COMPARISON:  Chest x-ray of 05/22/2011 FINDINGS: The lungs are not as well aerated with probable mild volume loss at the bases left-greater-than-right. No definite pneumonia or pleural effusion is seen. Cardiomegaly is stable. No bony abnormality is noted. IMPRESSION: Diminished aeration with mild basilar volume loss. No definite active process. Stable cardiomegaly. Electronically Signed   By: Ivar Drape M.D.   On: 07/16/2017 12:26   Ct Image Guided Drainage By  Percutaneous Catheter  Result Date: 07/17/2017 INDICATION: Liver abscess EXAM: CT GUIDED DRAINAGE OF HEPATIC ABSCESS MEDICATIONS: The patient is currently admitted to the hospital and receiving intravenous antibiotics. The antibiotics were administered within an appropriate time frame prior to the initiation of the procedure. ANESTHESIA/SEDATION: Two mg IV Versed 100 mcg IV Fentanyl Moderate Sedation Time:  16 minutes The patient was continuously monitored during the procedure by the interventional radiology nurse under my direct supervision. COMPLICATIONS: None immediate. TECHNIQUE: Informed written consent was obtained from the patient after a thorough discussion of the procedural risks, benefits and alternatives. All questions were addressed. Maximal Sterile Barrier Technique was utilized including caps, mask, sterile gowns, sterile gloves, sterile drape, hand hygiene and skin antiseptic. A timeout was performed prior to the initiation of the procedure. PROCEDURE: The upper abdomen was prepped with ChloraPrep in a sterile fashion, and a sterile drape was applied covering the operative field. A sterile gown and sterile gloves were used for the procedure. Local anesthesia was provided with 1% Lidocaine. Under CT guidance, an 18 gauge needle was advanced into the left lobe liver abscess. The needle was removed over an Amplatz wire. Ten [place] dilator followed by a 10 [place] drain were inserted. It was looped and string fixed in the fluid collection. Frank pus was aspirated. FINDINGS: Images demonstrate placement of a 10 French drain into a hepatic abscess in the left lobe. IMPRESSION: Successful 10 French left lobe hepatic abscess drain placement. Electronically Signed   By: Marybelle Killings M.D.   On: 07/17/2017 11:17   Recent Results (from the past 240 hour(s))  Culture, blood (routine x 2)     Status: None (Preliminary result)   Collection Time: 07/15/17  9:42 PM  Result Value Ref Range Status   Specimen  Description BLOOD LEFT HAND  Final   Special Requests   Final    BOTTLES DRAWN AEROBIC AND ANAEROBIC Blood Culture adequate volume Performed at [facility] [address], [place], [place] [address]    Culture   Final    NO GROWTH 2 DAYS Performed at [facility] [address], [place], [place] [address]    Report Status PENDING  Incomplete  Culture, blood (routine x 2)     Status: None (Preliminary result)   Collection Time: 07/15/17  9:43 PM  Result Value Ref Range Status   Specimen Description BLOOD RIGHT HAND  Final   Special Requests   Final    BOTTLES DRAWN AEROBIC AND  ANAEROBIC Blood Culture adequate volume Performed at [facility] [address] [place], [place] [address]    Culture   Final    NO GROWTH 2 DAYS Performed at [facility] [address], [place], [place] [address]    Report Status PENDING  Incomplete  Aerobic/Anaerobic Culture (surgical/deep wound)     Status: None (Preliminary result)   Collection Time: 07/16/17  5:06 PM  Result Value Ref Range Status   Specimen Description   Final    ABSCESS LIVER Performed at [facility], [place] [address], [place], [place] [address]    Special Requests   Final    NONE Performed at [facility], [place] [address], [place], [place] [address]    Gram Stain   Final    ABUNDANT WBC PRESENT, PREDOMINANTLY PMN ABUNDANT GRAM POSITIVE COCCI    Culture   Final    NO GROWTH < 24 HOURS Performed at [facility], [place] [address] [place], [place] [address]    Report Status PENDING  Incomplete    Microbiology: Recent Results (from the past 240 hour(s))  Culture, blood (routine x 2)     Status: None (Preliminary result)   Collection Time: 07/15/17  9:42 PM  Result Value Ref Range Status   Specimen Description BLOOD LEFT HAND  Final   Special Requests   Final    BOTTLES DRAWN AEROBIC AND ANAEROBIC Blood Culture adequate volume Performed at  [facility] [address] [place], [place] [address]    Culture   Final    NO GROWTH 2 DAYS Performed at [facility] [address] [place], [place] [address]    Report Status PENDING  Incomplete  Culture, blood (routine x 2)     Status: None (Preliminary result)   Collection Time: 07/15/17  9:43 PM  Result Value Ref Range Status   Specimen Description BLOOD RIGHT HAND  Final   Special Requests   Final    BOTTLES DRAWN AEROBIC AND ANAEROBIC Blood Culture adequate volume Performed at [facility] [address] [place], [place] [address]    Culture   Final    NO GROWTH 2 DAYS Performed at [facility] [address], [place], [place] [address]    Report Status PENDING  Incomplete  Aerobic/Anaerobic Culture (surgical/deep wound)     Status: None (Preliminary result)   Collection Time: 07/16/17  5:06 PM  Result Value Ref Range Status   Specimen Description   Final    ABSCESS LIVER Performed at [facility], [place] [address], [place], [place] [address]    Special Requests   Final    NONE Performed at [facility], [place] [address], [place], [place] [address]    Gram Stain   Final    ABUNDANT WBC PRESENT, PREDOMINANTLY PMN ABUNDANT GRAM POSITIVE COCCI    Culture   Final    NO GROWTH < 24 HOURS Performed at [facility], [place] [address] [place], [place] [address]    Report Status PENDING  Incomplete    Radiographs and labs were personally reviewed by me.    No Known Allergies  OPAT Orders Discharge antibiotics: ceftriaxone 2g qday Duration: 3 weeks End Date: 08-04-17  [facility] Care Per Protocol:  Labs weekly while on IV antibiotics: _x_ CBC with differential __ BMP _x_ CMP __ CRP __ ESR __ Vancomycin trough  _x_ Please  pull PIC at completion of IV antibiotics __ Please leave PIC in place until doctor has seen patient or been notified  Fax weekly labs to [phone]  Clinic Follow Up Appt: [place] 3 weeks   Bobby Rumpf, MD [facility] [phone] 07/17/2017, 1:47 PM

## 2017-07-17 NOTE — Progress Notes (Signed)
CC: LAP-BAND erosion with liver abscess  Subjective: Patient is up and seems fairly comfortable.  IR drain has just been emptied I am not sure of the volume.  But he has about 5 cc of purulent fluid still in the base of the bulb.  He has no tenderness or discomfort.  He says overall he feels much better.  He is upset over HIPPA  restrictions about releasing his blood cultures from [place].  Lungs are clear.  Objective: Vital signs in last 24 hours: Temp:  [97.4 F (36.3 C)-97.5 F (36.4 C)] 97.4 F (36.3 C) (07/03 0536) Pulse Rate:  [57-73] 57 (07/03 0536) Resp:  [18-22] 18 (07/03 0536) BP: (117-139)/(74-89) 138/84 (07/03 0536) SpO2:  [93 %-99 %] 97 % (07/03 0536) Last BM Date: 07/13/17 240 PO 1000 IV Nothing recorded for output yesterday Afebrile, VSS WBC still up 18K Creatinine is stable Renal US OK 2 D Echo  - LVH, EF 40-45% with diffuse hypokinesis, grade 1 diastolic dysfunction, mild left ventricular and right ventricular enlargement. Telemetry shows of bradycardia, couple strips are labeled as atrial fibrillation but I cannot tell from the strips and I cannot really print them.  Will defer to medicine for this. IR drain placement ABSCESS LIVER  07/16/17 Performed at [facility], [address] [place], [place] [address]     Special Requests NONE  Performed at [facility], [address] [place], [place] [address]     Gram Stain ABUNDANT WBC PRESENT, PREDOMINANTLY PMN  ABUNDANT GRAM POSITIVE COCCI       Intake/Output from previous day: 07/02 0701 - 07/03 0700 In: 985.8 [I.V.:650; IV Piggyback:330.8] Out: -  Intake/Output this shift: Total I/O In: 240 [P.O.:240] Out: -   General appearance: alert, cooperative and no distress Resp: clear to auscultation bilaterally GI: soft, non-tender; bowel sounds normal; no masses,  no organomegaly and IR drain with purulent fluid in the bulb.  Lab Results:  Recent Labs    07/16/17 0552  07/17/17 0534  WBC 17.1* 18.3*  HGB 12.2* 13.0  HCT 36.4* 39.3  PLT 385 419*    BMET Recent Labs    07/16/17 0552 07/17/17 0534  NA 138 138  K 3.6 4.2  CL 105 105  CO2 24 24  GLUCOSE 169* 124*  BUN 41* 47*  CREATININE 1.94* 1.87*  CALCIUM 8.0* 8.2*   PT/INR Recent Labs    07/15/17 2143  LABPROT 14.9  INR 1.18    Recent Labs  Lab 07/15/17 2143  AST 33  ALT 25  ALKPHOS 125  BILITOT 2.2*  PROT 6.5  ALBUMIN 2.1*     Lipase  No results found for: LIPASE   Medications: . amLODipine  10 mg Oral Daily  . heparin  5,000 Units Subcutaneous Q8H  . metoprolol tartrate  50 mg Oral Daily  . sodium chloride flush  5 mL Intracatheter Q8H  . tamsulosin  0.4 mg Oral QPC supper   . sodium chloride 75 mL/hr at 07/17/17 0933  . famotidine (PEPCID) IV 20 mg (07/17/17 0933)  . piperacillin-tazobactam (ZOSYN)  IV Stopped (07/17/17 0939)  . vancomycin     Anti-infectives (From admission, onward)   Start     Dose/Rate Route Frequency Ordered Stop   07/17/17 1800  vancomycin (VANCOCIN) 1,500 mg in sodium chloride 0.9 % 500 mL IVPB     1,500 mg 250 mL/hr over 120 Minutes Intravenous Every 36 hours 07/16/17 0605     07/16/17 0615  vancomycin (VANCOCIN) 1,500 mg in sodium chloride 0.9 % 500 mL IVPB     1,500 mg 250 mL/hr over 120 Minutes Intravenous  Once 07/16/17 0605 07/16/17 1058   07/15/17 2200  piperacillin-tazobactam (ZOSYN) IVPB 3.375 g     3.375 g 12.5 mL/hr over 240 Minutes Intravenous Every 8 hours 07/15/17 2128     07/15/17 2200  vancomycin (VANCOCIN) IVPB 1000 mg/200 mL premix     1,000 mg 200 mL/hr over 60 Minutes Intravenous  Once 07/15/17 2128 07/15/17 2345      Assessment/Plan Left pleural effusion CXR:  Cardiomegaly with pulmonary venous congestion Sleep apnea; does not use CPAP EF 40 to 45%, stage I diastolic dysfunction BMI 50.34  Prediabetes diagnosis - checking A1C Acute urinary retention - foley, will add Flomax later today Hypertension Acute  kidney injury Tobacco abuse - ongoing GERD Gout  Sepsis with liver abscess 8 x 5 cm Blood culture = Gram positive cocci Gastric band erosion Gastric Lap band placement 2009 - no follow up  FEN:  IV fluids/clear liquids ID:  Vancomycin/Zosyn/fuconazole 07/15/17 =>> day 3 DVT: Heparin =>> go to Lovenox per pharmacy/SCD's Follow up:  TBD Foley:  In place   Plan: Continue IV antibiotics.  Blood culture results listed from 07/14/2017, and Kathryne Sharper shows: Streptococcus species. Not Streptococcus agalactiae (Group B Strep), Streptococcus pyogenes (Group A Strep), or Streptococcus pneumoniae -we will ask for ID consult here.  Telemetry reports some atrial fibrillation but I cannot print the strips I cannot really tell if it is.  I will defer to medicine on this.  2D echo shows an EF of 40 to 45% stage I diastolic dysfunction.  Will defer to Medicine on this also.  Will discuss with Dr. Daphine Deutscher when he plans to remove the LAP-BAND. Pt is anxious to go home.         LOS: 2 days    Joslyn Ramos 07/17/2017 [phone]

## 2017-07-17 NOTE — Progress Notes (Signed)
[facility] PROGRESS NOTE    Progress Note  Trevor Serrano  NWG:[id number] DOB: Jun 07, 1955 DOA: 07/15/2017 PCP: Catha Gosselin, MD     Brief Narrative:   Trevor Serrano is an 62 y.o. male past medical history of morbid obesity, gout, essential hypertension underwent gastric bypass 10 years ago who presented to [place] on 07/14/2017 complaining of abdominal pain she was noted to be febrile with an elevated white count CT was performed which showed fatty liver and possible hepatic abscesses, she was started empirically on IV Flagyl and ertapenem ID was consulted who recommended to switch to IV Vanco and Zosyn and Flagyl, Dr. Daphine Deutscher was consulted over from her initial surgery and recommended transfer to their service to [facility] long hospital medical team was consulted due to bacteremia with 2 out of 2 blood cultures growing gram-positive cocci    Assessment/Plan:   Sepsis due to liver mass, left lobe 2 out of 2 blood cultures grew gram-positive cocci patient currently on IV Vanco Comycin and Zosyn, pharmacy has been consulted for dosing antibiotics. IR was consulted and perform CT-guided drain formal on 07/16/2017 showed 10 cc of purulent material culture data pending Will try to obtain blood culture form [facility] for speciation ans sensitivities.  AKI: With a baseline creatinine back in 2015 of less than 1, on admission 1.9, will continue IV fluids as creatinine is gently trending down. 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 Drisdol and showed no hydronephrosis, no signs of chronic renal disease.  Gastric band erosion: Continue management per surgery  Hypoxia at [facility]: BNP of 146, physical exam unreliable, chest x-ray shows no signs of fluid overload. 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  Acute urinary retention: He was started on Flomax  Foley has been removed  Essential hypertension: Continue metoprolol and Norvasc hydralazine IV as needed.    DVT prophylaxis: lovenxo Family Communication:none Disposition Plan/Barrier to D/C: home once we [place] [facility] [place] change him to oral Code Status:     Code Status Orders  (From admission, onward)        Start     Ordered   07/15/17 2121  Full code  Continuous     07/15/17 2121    Code Status History    This patient has a current code status but no historical code status.    Advance Directive Documentation     Most Recent Value  Type of Advance Directive  Out of facility DNR (pink MOST or yellow form)  Pre-existing out of facility DNR order (yellow form or pink MOST form)  -  "MOST" Form in Place?  -        IV Access:    Peripheral IV   Procedures and diagnostic studies:   Ct Abdomen Pelvis W Contrast  Addendum Date: 07/16/2017   ADDENDUM REPORT: 07/16/2017 14:21 ADDENDUM: Critical Value/emergent results were called by telephone at the time of interpretation on 07/16/2017 at 2:21 pm to Dr. Carolynne Edouard, General Surgery, who verbally acknowledged these results. Electronically Signed   By: Bretta Bang III M.D.   On: 07/16/2017 14:21   Result Date: 07/16/2017 CLINICAL DATA:  Fever with reported liver abscess EXAM: CT ABDOMEN AND PELVIS WITH CONTRAST TECHNIQUE: Multidetector CT imaging of the abdomen and pelvis was performed using the standard protocol following bolus administration of intravenous contrast. CONTRAST:  75mL OMNIPAQUE IOHEXOL 300 MG/ML  SOLN COMPARISON:  None. Reported [facility] not available currently to compare. FINDINGS: Lower chest: There are small pleural effusions bilaterally with bibasilar atelectatic change. Hepatobiliary: Liver measures 22.4 cm in length. There is diffuse hepatic steatosis. There is a fluid collection involving much of the lateral segment right lobe of the liver measuring 8.2 x 8.0 x 5.0 cm  consistent with abscess. There is no appreciable air in this area. Note that this abscess abuts a portion of the tubing from a lap band. Pancreas: No pancreatic mass or inflammatory focus. Spleen: No splenic lesions are evident. Spleen measures 14.0 x 13.8 x 11.3 cm with a measured splenic volume of 1,092 cubic cm. Adrenals/Urinary Tract: Adrenals appear unremarkable bilaterally. There is a cyst arising from the lateral mid left kidney measuring 4.0 x 3.3 cm. There is an 8 x 8 mm cyst in the lower pole of the right kidney laterally. There is no hydronephrosis on either side. There is no evident renal or ureteral calculus on either side. Urinary bladder is midline with wall thickness within normal limits. There is a small urachal remnant without associated mass or inflammatory focus. Stomach/Bowel: There is a lap band positioned at the gastric cardia. There are foci of air outside of bowel lumen immediately adjacent to portions of the tubing of this lap band in the left upper quadrant. There is no appreciable bowel wall or mesenteric thickening. There is air along the wall of the stomach near the insertion for the lap band tubing suggesting erosion from the lap band region through the wall of the stomach. No other free air seen. No portal venous air evident. No evident bowel obstruction. There are scattered colonic diverticula without diverticulitis. Vascular/Lymphatic: There are foci of aortic and left common iliac artery atherosclerosis. No evident aneurysm. No free air or portal venous air. Reproductive: There are foci of prostatic calculi. Prostate and seminal vesicles appear normal in size and contour. No pelvic masses evident. Other: Appendix appears normal. Outside of the liver, there is no evident abscess in the abdomen or pelvis. There is no appreciable ascites in the abdomen or pelvis. There is a ventral hernia containing only fat. There is fat in each in inguinal ring. Musculoskeletal: There is  degenerative change in the lower thoracic and lumbar spine regions. There is anterior wedging of several lower thoracic vertebral bodies. There is no evident blastic or lytic bone lesion. There is no intramuscular lesion evident. IMPRESSION: 1. Abscess in the left lobe of the liver measuring 8.1 x 8.0 x 5.0 cm. Currently no air is appreciable within this abscess. This abscess abuts the tubing for the lap band just to the left of midline in the left upper abdomen. 2. Lap band position in gastric cardia region. There is air tracking along the edge of the lap band into the wall the stomach and along portions of the tubing outside of bowel lumen. There appears to be erosion of the lap band causing leakage of air from the upper stomach. The amount of free air seen is slight. 3. Splenomegaly. Liver is also enlarged. There is hepatic steatosis. 4. No abscess is seen apart from the liver abscess. No ascites evident. Appendix region appears normal. There are scattered colonic diverticula without diverticulitis. No evident bowel obstruction. 5.  No renal or ureteral calculus.  No hydronephrosis. 6.  There is aortoiliac atherosclerosis. 7. There is a ventral hernia containing only fat. There is fat in each inguinal ring. 8.  Small pleural effusions bilaterally with bibasilar atelectasis.  Aortic Atherosclerosis (ICD10-I70.0). Electronically Signed: By: Bretta Bang III M.D. On: 07/16/2017 14:12   US Renal  Result Date: 07/16/2017 CLINICAL DATA:  62 year old male with acute kidney insufficiency. Subsequent encounter. EXAM: RENAL / URINARY TRACT ULTRASOUND COMPLETE COMPARISON:  07/16/2017 CT. FINDINGS: Right Kidney: Length: 12.7 cm. Echogenicity within normal limits. No mass or hydronephrosis visualized. Left Kidney: Length: 12.8 cm. Echogenicity within normal limits. No hydronephrosis. 3.3 x 3.8 x 3.2 cm lower pole cyst. Bladder: Appears normal for degree of bladder distention. IMPRESSION: No hydronephrosis. 3.8 cm left  renal cyst. Electronically Signed   By: Lacy Duverney M.D.   On: 07/16/2017 17:28   Dg Chest Port 1 View  Result Date: 07/16/2017 CLINICAL DATA:  Increase shortness of breath, hypoxia EXAM: PORTABLE CHEST 1 VIEW COMPARISON:  Chest x-ray of 05/22/2011 FINDINGS: The lungs are not as well aerated with probable mild volume loss at the bases left-greater-than-right. No definite pneumonia or pleural effusion is seen. Cardiomegaly is stable. No bony abnormality is noted. IMPRESSION: Diminished aeration with mild basilar volume loss. No definite active process. Stable cardiomegaly. Electronically Signed   By: Dwyane Dee M.D.   On: 07/16/2017 12:26     Medical Consultants:    None.  Anti-Infectives:   IV vAnc and zosyn  Subjective:    Trevor Serrano no complain feels great wants to go home  Objective:    Vitals:   07/16/17 1615 07/16/17 1950 07/17/17 0500 07/17/17 0536  BP: 136/84 117/74  138/84  Pulse: 73 60  (!) 57  Resp: 20 19  18   Temp:    (!) 97.4 F (36.3 C)  TempSrc:    Oral  SpO2: 96% 97% 97% 97%  Weight:      Height:        Intake/Output Summary (Last 24 hours) at 07/17/2017 0829 Last data filed at 07/17/2017 0539 Gross per 24 hour  Intake 985.83 ml  Output -  Net 985.83 ml   Filed Weights   07/16/17 0555  Weight: (!) 150.2 kg (331 lb 1.6 oz)    Exam: General exam: In no acute distress. Respiratory system: Good air movement and clear to auscultation. Cardiovascular system: S1 & S2 heard, RRR.  Gastrointestinal system: Abdomen is nondistended, soft and nontender.  Central nervous system: Alert and oriented. No focal neurological deficits. Extremities: No pedal edema. Skin: No rashes, lesions or ulcers Psychiatry: Judgement and insight appear normal. Mood & affect appropriate.    Data Reviewed:    Labs: Basic Metabolic Panel: Recent Labs  Lab 07/15/17 2143 07/16/17 0552 07/17/17 0534  NA 135 138 138  K 3.9 3.6 4.2  CL 105 105 105  CO2 19* 24 24    GLUCOSE 185* 169* 124*  BUN 37* 41* 47*  CREATININE 1.97* 1.94* 1.87*  CALCIUM 7.9* 8.0* 8.2*  MG 2.0  --   --   PHOS 4.3  --   --    GFR Estimated Creatinine Clearance: 57.1 mL/min (A) (by C-G formula based on SCr of 1.87 mg/dL (H)). Liver Function Tests: Recent Labs  Lab 07/15/17 2143  AST 33  ALT 25  ALKPHOS 125  BILITOT 2.2*  PROT 6.5  ALBUMIN 2.1*   No results for input(s): LIPASE, AMYLASE in the last 168 hours. No results for input(s): AMMONIA in the last 168 hours. Coagulation profile Recent Labs  Lab 07/15/17 2143  INR 1.18    CBC: Recent Labs  Lab 07/15/17 2143 07/16/17 0552 07/17/17 0534  WBC 18.2* 17.1* 18.3*  NEUTROABS 16.6*  --   --   HGB 12.8* 12.2* 13.0  HCT 37.1* 36.4* 39.3  MCV 91.2 91.7 91.6  PLT 341 385 419*   Cardiac Enzymes: No results for input(s): CKTOTAL, CKMB, CKMBINDEX, TROPONINI in the last 168 hours. BNP (last 3 results) No results for input(s): PROBNP in the last 8760 hours. CBG: No results for input(s): GLUCAP in the last 168 hours. D-Dimer: No results for input(s): DDIMER in the last 72 hours. Hgb A1c: Recent Labs    07/16/17 0552  HGBA1C 6.6*   Lipid Profile: No results for input(s): CHOL, HDL, LDLCALC, TRIG, CHOLHDL, LDLDIRECT in the last 72 hours. Thyroid function studies: Recent Labs    07/15/17 06/08/2141  TSH 0.720   Anemia work up: No results for input(s): VITAMINB12, FOLATE, FERRITIN, TIBC, IRON, RETICCTPCT in the last 72 hours. Sepsis Labs: Recent Labs  Lab 07/15/17 2141-06-08 07/16/17 0552 07/17/17 0534  WBC 18.2* 17.1* 18.3*   Microbiology Recent Results (from the past 240 hour(s))  Culture, blood (routine x 2)     Status: None (Preliminary result)   Collection Time: 07/15/17  9:42 PM  Result Value Ref Range Status   Specimen Description BLOOD LEFT HAND  Final   Special Requests   Final    BOTTLES DRAWN AEROBIC AND ANAEROBIC Blood Culture adequate volume Performed at [facility], [address] [place], [place] [address]    Culture   Final    NO GROWTH < 12 HOURS Performed at [facility], [address], [place], [place] [address]    Report Status PENDING  Incomplete  Culture, blood (routine x 2)     Status: None (Preliminary result)   Collection Time: 07/15/17  9:43 PM  Result Value Ref Range Status   Specimen Description BLOOD RIGHT HAND  Final   Special Requests   Final    BOTTLES DRAWN AEROBIC AND ANAEROBIC Blood Culture adequate volume Performed at [facility], [address], [place], [place] [address]    Culture   Final    NO GROWTH < 12 HOURS Performed at [facility], [address] [place], [place] [address]    Report Status PENDING  Incomplete  Aerobic/Anaerobic Culture (surgical/deep wound)     Status: None (Preliminary result)   Collection Time: 07/16/17  5:06 PM  Result Value Ref Range Status   Specimen Description   Final    ABSCESS LIVER Performed at [facility], [address], [place], [place] [address]    Special Requests   Final    NONE Performed at [facility], [address] [place], [place] [address]    Gram Stain   Final    ABUNDANT WBC PRESENT, PREDOMINANTLY PMN ABUNDANT GRAM POSITIVE COCCI    Culture PENDING  Incomplete   Report Status PENDING  Incomplete     Medications:   . amLODipine  10 mg Oral Daily  . heparin  5,000 Units Subcutaneous Q8H  . metoprolol tartrate  50 mg Oral Daily  . sodium chloride flush  5 mL Intracatheter Q8H  . tamsulosin  0.4 mg Oral QPC supper   Continuous Infusions: . sodium chloride 100 mL/hr at 07/16/17 0930  . famotidine (PEPCID) IV Stopped (07/17/17 0324)  . piperacillin-tazobactam (ZOSYN)  IV 3.375 g (07/17/17 0546)  . vancomycin        LOS: 2 days   Marinda Elk  [facility] Pager [phone]  *Please refer  to [email or web address], password [email or web address] to get updated schedule on who will round on this patient, as  hospitalists switch teams weekly. If 7PM-7AM, please contact night-coverage at [email or web address], password [email or web address] for any overnight needs.  07/17/2017, 8:29 AM

## 2017-07-17 NOTE — Progress Notes (Signed)
Picc team called, stated that picc would be placed tomorrow.  [facility] faxed release of medical information for blood culture final results on 3 different occasions.  Spoke to medical record representative twice.  Still waiting on medical records to be faxed.

## 2017-07-17 NOTE — Progress Notes (Signed)
ANTICOAGULATION CONSULT NOTE - Initial Consult  Pharmacy Consult for enoxaparin dosing Indication: VTE prophylaxis  No Known Allergies  Patient Measurements: Height: 5\' 8"  (172.7 cm) Weight: (!) 331 lb 1.6 oz (150.2 kg) IBW/kg (Calculated) : 68.4  Vital Signs: Temp: 97.4 F (36.3 C) (07/03 0536) Temp Source: Oral (07/03 0536) BP: 138/84 (07/03 0536) Pulse Rate: 57 (07/03 0536)  Labs: Recent Labs    07/15/17 2143 07/16/17 0552 07/17/17 0534  HGB 12.8* 12.2* 13.0  HCT 37.1* 36.4* 39.3  PLT 341 385 419*  APTT 32  --   --   LABPROT 14.9  --   --   INR 1.18  --   --   CREATININE 1.97* 1.94* 1.87*    Estimated Creatinine Clearance: 57.1 mL/min (A) (by C-G formula based on SCr of 1.87 mg/dL (H)).   Medical History: Past Medical History:  Diagnosis Date  . Diabetes mellitus without complication (HCC)   . Gout    Assessment: Patient underwent hepatic abscess drain placement on 7/2. Pharmacy consulted this morning to start enoxaparin for VTE prophylaxis.   Today, 07/17/17  Hgb 13  Plt 419  Weight = 150 kg (obese)   Plan:  Given obesity, will order enoxaparin 0.5 mg/kg (75 kg) subQ daily for DVT ppx per protocol.  Will recheck CBC with AM labs tomorrow. Pharmacy will sign off. Thank you for the consult.  Cindi CarbonMary M Lanier Millon, PharmD, BCPS Clinical Pharmacist 07/17/2017,11:05 AM

## 2017-07-17 NOTE — Progress Notes (Signed)
Referring Physician(s): Martin,M  Supervising Physician: Irish Lack  Patient Status:  [facility] - In-pt  Chief Complaint: Hepatic abscess   Subjective: Pt doing ok today; no major c/o ; has some soreness at epigastric drain site as expected   Allergies: Patient has no known allergies.  Medications: Prior to Admission medications   Medication Sig Start Date End Date Taking? Authorizing Provider  allopurinol (ZYLOPRIM) 100 MG tablet Take 100 mg by mouth 2 (two) times daily.    Yes [provider]  amLODipine (NORVASC) 10 MG tablet Take 10 mg by mouth daily.   Yes [provider]  aspirin EC 81 MG tablet Take 81 mg by mouth daily.   Yes [provider]  buPROPion (WELLBUTRIN) 100 MG tablet Take 100 mg by mouth 2 (two) times daily.    Yes [provider]  cetirizine (ZYRTEC) 10 MG tablet Take 10 mg by mouth daily.   Yes [provider]  esomeprazole (NEXIUM) 40 MG capsule Take 40 mg by mouth daily at 12 noon.   Yes [provider]  ferrous sulfate 325 (65 FE) MG tablet Take 325 mg by mouth daily with breakfast.   Yes [provider]  hydrochlorothiazide (MICROZIDE) 12.5 MG capsule Take 12.5 mg by mouth daily.   Yes [provider]  indomethacin (INDOCIN) 50 MG capsule Take 50 mg by mouth 2 (two) times daily as needed for mild pain or moderate pain.    Yes [provider]  metoprolol (LOPRESSOR) 50 MG tablet Take 50 mg by mouth daily.    Yes [provider]  Multiple Vitamin (MULTIVITAMIN WITH MINERALS) TABS tablet Take 1 tablet by mouth daily.   Yes [provider]  Omega-3 Fatty Acids (FISH OIL PO) Take 1,000 mg by mouth daily.   Yes [provider]  rosuvastatin (CRESTOR) 5 MG tablet Take 5 mg by mouth daily.   Yes [provider]  telmisartan (MICARDIS) 40 MG tablet Take 40 mg by mouth daily.   Yes [provider]  clindamycin (CLEOCIN) 300 MG capsule  Take 1 capsule (300 mg total) by mouth 4 (four) times daily. X 10 days Patient not taking: Reported on 07/15/2017 11/10/13   Purvis Sheffield, MD  indomethacin (INDOCIN) 25 MG capsule Take 1 capsule (25 mg total) by mouth 3 (three) times daily as needed. Patient not taking: Reported on 07/15/2017 11/10/13   Purvis Sheffield, MD     Vital Signs: BP 138/84 (BP Location: Left Arm)   Pulse (!) 57   Temp (!) 97.4 F (36.3 C) (Oral)   Resp 18   Ht 5\' 8"  (1.727 m)   Wt (!) 331 lb 1.6 oz (150.2 kg)   SpO2 97%   BMI 50.34 kg/m   Physical Exam left hepatic drain intact, dressing dry, site mildly tender, output about 100 cc brown fluid; drain flushes ok  Imaging: Ct Abdomen Pelvis W Contrast  Addendum Date: 07/16/2017   ADDENDUM REPORT: 07/16/2017 14:21 ADDENDUM: Critical Value/emergent results were called by telephone at the time of interpretation on 07/16/2017 at 2:21 pm to Dr. Carolynne Edouard, General Surgery, who verbally acknowledged these results. Electronically Signed   By: Bretta Bang III M.D.   On: 07/16/2017 14:21   Result Date: 07/16/2017 CLINICAL DATA:  Fever with reported liver abscess EXAM: CT ABDOMEN AND PELVIS WITH CONTRAST TECHNIQUE: Multidetector CT imaging of the abdomen and pelvis was performed using the standard protocol following bolus administration of intravenous contrast. CONTRAST:  75mL OMNIPAQUE IOHEXOL  300 MG/ML  SOLN COMPARISON:  None. Reported recent CT from [facility] not available currently to compare. FINDINGS: Lower chest: There are small pleural effusions bilaterally with bibasilar atelectatic change. Hepatobiliary: Liver measures 22.4 cm in length. There is diffuse hepatic steatosis. There is a fluid collection involving much of the lateral segment right lobe of the liver measuring 8.2 x 8.0 x 5.0 cm consistent with abscess. There is no appreciable air in this area. Note that this abscess abuts a portion of the tubing from a lap band. Pancreas: No pancreatic mass or  inflammatory focus. Spleen: No splenic lesions are evident. Spleen measures 14.0 x 13.8 x 11.3 cm with a measured splenic volume of 1,092 cubic cm. Adrenals/Urinary Tract: Adrenals appear unremarkable bilaterally. There is a cyst arising from the lateral mid left kidney measuring 4.0 x 3.3 cm. There is an 8 x 8 mm cyst in the lower pole of the right kidney laterally. There is no hydronephrosis on either side. There is no evident renal or ureteral calculus on either side. Urinary bladder is midline with wall thickness within normal limits. There is a small urachal remnant without associated mass or inflammatory focus. Stomach/Bowel: There is a lap band positioned at the gastric cardia. There are foci of air outside of bowel lumen immediately adjacent to portions of the tubing of this lap band in the left upper quadrant. There is no appreciable bowel wall or mesenteric thickening. There is air along the wall of the stomach near the insertion for the lap band tubing suggesting erosion from the lap band region through the wall of the stomach. No other free air seen. No portal venous air evident. No evident bowel obstruction. There are scattered colonic diverticula without diverticulitis. Vascular/Lymphatic: There are foci of aortic and left common iliac artery atherosclerosis. No evident aneurysm. No free air or portal venous air. Reproductive: There are foci of prostatic calculi. Prostate and seminal vesicles appear normal in size and contour. No pelvic masses evident. Other: Appendix appears normal. Outside of the liver, there is no evident abscess in the abdomen or pelvis. There is no appreciable ascites in the abdomen or pelvis. There is a ventral hernia containing only fat. There is fat in each in inguinal ring. Musculoskeletal: There is degenerative change in the lower thoracic and lumbar spine regions. There is anterior wedging of several lower thoracic vertebral bodies. There is no evident blastic or lytic bone  lesion. There is no intramuscular lesion evident. IMPRESSION: 1. Abscess in the left lobe of the liver measuring 8.1 x 8.0 x 5.0 cm. Currently no air is appreciable within this abscess. This abscess abuts the tubing for the lap band just to the left of midline in the left upper abdomen. 2. Lap band position in gastric cardia region. There is air tracking along the edge of the lap band into the wall the stomach and along portions of the tubing outside of bowel lumen. There appears to be erosion of the lap band causing leakage of air from the upper stomach. The amount of free air seen is slight. 3. Splenomegaly. Liver is also enlarged. There is hepatic steatosis. 4. No abscess is seen apart from the liver abscess. No ascites evident. Appendix region appears normal. There are scattered colonic diverticula without diverticulitis. No evident bowel obstruction. 5.  No renal or ureteral calculus.  No hydronephrosis. 6.  There is aortoiliac atherosclerosis. 7. There is a ventral hernia containing only fat. There is fat in each inguinal ring. 8.  Small  pleural effusions bilaterally with bibasilar atelectasis. Aortic Atherosclerosis (ICD10-I70.0). Electronically Signed: By: Bretta BangWilliam  Woodruff III M.D. On: 07/16/2017 14:12   [place] Renal  Result Date: 07/16/2017 CLINICAL DATA:  62 year old male with acute kidney insufficiency. Subsequent encounter. EXAM: RENAL / URINARY TRACT ULTRASOUND COMPLETE COMPARISON:  07/16/2017 CT. FINDINGS: Right Kidney: Length: 12.7 cm. Echogenicity within normal limits. No mass or hydronephrosis visualized. Left Kidney: Length: 12.8 cm. Echogenicity within normal limits. No hydronephrosis. 3.3 x 3.8 x 3.2 cm lower pole cyst. Bladder: Appears normal for degree of bladder distention. IMPRESSION: No hydronephrosis. 3.8 cm left renal cyst. Electronically Signed   By: Lacy DuverneySteven  Olson M.D.   On: 07/16/2017 17:28   Dg Chest Port 1 View  Result Date: 07/16/2017 CLINICAL DATA:  Increase shortness of breath,  hypoxia EXAM: PORTABLE CHEST 1 VIEW COMPARISON:  Chest x-ray of 05/22/2011 FINDINGS: The lungs are not as well aerated with probable mild volume loss at the bases left-greater-than-right. No definite pneumonia or pleural effusion is seen. Cardiomegaly is stable. No bony abnormality is noted. IMPRESSION: Diminished aeration with mild basilar volume loss. No definite active process. Stable cardiomegaly. Electronically Signed   By: Dwyane DeePaul  Barry M.D.   On: 07/16/2017 12:26    Labs:  CBC: Recent Labs    07/15/17 2143 07/16/17 0552 07/17/17 0534  WBC 18.2* 17.1* 18.3*  HGB 12.8* 12.2* 13.0  HCT 37.1* 36.4* 39.3  PLT 341 385 419*    COAGS: Recent Labs    07/15/17 2143  INR 1.18  APTT 32    BMP: Recent Labs    07/15/17 2143 07/16/17 0552 07/17/17 0534  NA 135 138 138  K 3.9 3.6 4.2  CL 105 105 105  CO2 19* 24 24  GLUCOSE 185* 169* 124*  BUN 37* 41* 47*  CALCIUM 7.9* 8.0* 8.2*  CREATININE 1.97* 1.94* 1.87*  GFRNONAA 34* 35* 36*  GFRAA 40* 40* 42*    LIVER FUNCTION TESTS: Recent Labs    07/15/17 2143  BILITOT 2.2*  AST 33  ALT 25  ALKPHOS 125  PROT 6.5  ALBUMIN 2.1*    Assessment and Plan: 62 y.o. male, status post laparoscopic lap band placed on 01/10/2010, [facility] with left upper quadrant pain, rigors, , fever, malaise and abdominal pain.  EGD showed circumferential black lap band had eroded through the wall of the stomach.  Recent CT shows abscess in left hepatic lobe measuring up to 8 cm.  The abscess abuts the tubing for the lap band just to the left of midline in the left upper abdomen; s/p left hepatic drain placement 7/2; afebrile; WBC 18.3(17.1), hgb 13, creat 1.87(1.94), drain fluid cx pend; cont with once daily irrigation of drain as OP, recording of output and dressing changes daily; he can be scheduled for f/u CT at IR drain clinic within next 1-2 weeks; other plans per [facility].     Electronically Signed: D. Jeananne RamaKevin  Novak Stgermaine, PA-C 07/17/2017, 9:52 AM   I spent a total of 15 minutes at the the patient's bedside AND on the patient's hospital floor or unit, greater than 50% of which was counseling/coordinating care for hepatic abscess drain    Patient ID: Trevor Serrano, male   DOB: 26-Apr-1955, 62 y.o.   MRN: [id number]

## 2017-07-18 ENCOUNTER — Encounter (HOSPITAL_COMMUNITY): Payer: Self-pay | Admitting: *Deleted

## 2017-07-18 DIAGNOSIS — K9509 Other complications of gastric band procedure: Secondary | ICD-10-CM

## 2017-07-18 DIAGNOSIS — K651 Peritoneal abscess: Secondary | ICD-10-CM

## 2017-07-18 LAB — CBC
HEMATOCRIT: 37 % — AB (ref 39.0–52.0)
Hemoglobin: 12.4 g/dL — ABNORMAL LOW (ref 13.0–17.0)
MCH: 30.5 pg (ref 26.0–34.0)
MCHC: 33.5 g/dL (ref 30.0–36.0)
MCV: 90.9 fL (ref 78.0–100.0)
PLATELETS: 408 10*3/uL — AB (ref 150–400)
RBC: 4.07 MIL/uL — ABNORMAL LOW (ref 4.22–5.81)
RDW: 15.1 % (ref 11.5–15.5)
WBC: 11.4 10*3/uL — AB (ref 4.0–10.5)

## 2017-07-18 LAB — BASIC METABOLIC PANEL
Anion gap: 7 (ref 5–15)
BUN: 41 mg/dL — AB (ref 8–23)
CHLORIDE: 110 mmol/L (ref 98–111)
CO2: 24 mmol/L (ref 22–32)
CREATININE: 1.63 mg/dL — AB (ref 0.61–1.24)
Calcium: 8 mg/dL — ABNORMAL LOW (ref 8.9–10.3)
GFR calc Af Amer: 50 mL/min — ABNORMAL LOW (ref >60)
GFR calc non Af Amer: 43 mL/min — ABNORMAL LOW (ref >60)
GLUCOSE: 95 mg/dL (ref 70–99)
POTASSIUM: 4 mmol/L (ref 3.5–5.1)
Sodium: 141 mmol/L (ref 135–145)

## 2017-07-18 LAB — HEPATITIS C ANTIBODY: HCV Ab: <0.1 s/co ratio (ref 0.0–0.9)

## 2017-07-18 MED ORDER — SODIUM CHLORIDE 0.9% FLUSH
10.0000 mL | INTRAVENOUS | Status: DC | PRN
  Administered 2017-07-18: 10 mL
  Filled 2017-07-18: qty 40

## 2017-07-18 MED ORDER — HEPARIN SOD (PORK) LOCK FLUSH 100 UNIT/ML IV SOLN
250.0000 [IU] | INTRAVENOUS | Status: AC | PRN
  Administered 2017-07-18: 250 [IU]

## 2017-07-18 MED ORDER — SODIUM CHLORIDE 0.9 % IV SOLN
2.0000 g | INTRAVENOUS | Status: DC
  Administered 2017-07-18: 2 g via INTRAVENOUS
  Filled 2017-07-18: qty 2

## 2017-07-18 MED ORDER — CEFTRIAXONE IV (FOR PTA / DISCHARGE USE ONLY): 2.0000 g | INTRAVENOUS | 0 refills | Status: AC

## 2017-07-18 MED ORDER — FUROSEMIDE 10 MG/ML IJ SOLN: 20.0000 mg | Freq: Once | INTRAMUSCULAR | Status: DC

## 2017-07-18 MED ORDER — FUROSEMIDE 10 MG/ML IJ SOLN: 40.0000 mg | Freq: Once | INTRAMUSCULAR | Status: DC

## 2017-07-18 MED ORDER — HYDROCHLOROTHIAZIDE 12.5 MG PO CAPS: 12.5000 mg | ORAL_CAPSULE | Freq: Every day | ORAL | Status: DC

## 2017-07-18 NOTE — Progress Notes (Signed)
[facility] for Mr. Trevor Serrano at [place] to support home IV ABX in the home.  [facility] will teach with pt and wife to support IV ABX at home.   If patient discharges after hours, please call [phone].   Sedalia Mutaamela S Chandler 07/18/2017, 10:20 AM

## 2017-07-18 NOTE — Progress Notes (Signed)
Referring Physician(s): Martin,M  Supervising Physician: Corrie Mckusick  Patient Status:  [facility] - In-pt  Chief Complaint:  Hepatic abscess  Subjective:  Pt doing well; no new c/o; anxious to go home; awaiting PICC placement  Allergies: Patient has no known allergies.  Medications: Prior to Admission medications   Medication Sig Start Date End Date Taking? Authorizing Provider  allopurinol (ZYLOPRIM) 100 MG tablet Take 100 mg by mouth 2 (two) times daily.    Yes [provider]  amLODipine (NORVASC) 10 MG tablet Take 10 mg by mouth daily.   Yes [provider]  aspirin EC 81 MG tablet Take 81 mg by mouth daily.   Yes [provider]  buPROPion (WELLBUTRIN) 100 MG tablet Take 100 mg by mouth 2 (two) times daily.    Yes [provider]  cetirizine (ZYRTEC) 10 MG tablet Take 10 mg by mouth daily.   Yes [provider]  esomeprazole (NEXIUM) 40 MG capsule Take 40 mg by mouth daily at 12 noon.   Yes [provider]  ferrous sulfate 325 (65 FE) MG tablet Take 325 mg by mouth daily with breakfast.   Yes [provider]  hydrochlorothiazide (MICROZIDE) 12.5 MG capsule Take 12.5 mg by mouth daily.   Yes [provider]  indomethacin (INDOCIN) 50 MG capsule Take 50 mg by mouth 2 (two) times daily as needed for mild pain or moderate pain.    Yes [provider]  metoprolol (LOPRESSOR) 50 MG tablet Take 50 mg by mouth daily.    Yes [provider]  Multiple Vitamin (MULTIVITAMIN WITH MINERALS) TABS tablet Take 1 tablet by mouth daily.   Yes [provider]  Omega-3 Fatty Acids (FISH OIL PO) Take 1,000 mg by mouth daily.   Yes [provider]  rosuvastatin (CRESTOR) 5 MG tablet Take 5 mg by mouth daily.   Yes [provider]  telmisartan (MICARDIS) 40 MG tablet Take 40 mg by mouth daily.   Yes [provider]  cefTRIAXone (ROCEPHIN) IVPB Inject 2 g into the vein daily  for 18 days. Indication:  Liver abscess Last Day of Therapy:  08/04/17 Labs - Once weekly:  CBC/D and BMP, Labs - Every other week:  ESR and CRP 07/18/17 08/05/17  Armandina Gemma, MD  clindamycin (CLEOCIN) 300 MG capsule Take 1 capsule (300 mg total) by mouth 4 (four) times daily. X 10 days Patient not taking: Reported on 07/15/2017 11/10/13   Pamella Pert, MD  indomethacin (INDOCIN) 25 MG capsule Take 1 capsule (25 mg total) by mouth 3 (three) times daily as needed. Patient not taking: Reported on 07/15/2017 11/10/13   Pamella Pert, MD     Vital Signs: BP (!) 108/55 (BP Location: Left Arm)   Pulse 64   Temp 98 F (36.7 C) (Oral)   Resp 20   Ht '5\' 8"'  (1.727 m)   Wt (!) 331 lb 1.6 oz (150.2 kg)   SpO2 95%   BMI 50.34 kg/m   Physical Exam awake/alert; epig/hepatic drain intact, insertion site ok, NT; output 70 cc light brown fluid; abd soft  Imaging: Ct Abdomen Pelvis W Contrast  Addendum Date: 07/16/2017   ADDENDUM REPORT: 07/16/2017 14:21 ADDENDUM: Critical Value/emergent results were called by telephone at the time of interpretation on 07/16/2017 at 2:21 pm to Dr. Marlou Starks, General Surgery, who verbally acknowledged these results. Electronically Signed   By: Lowella Grip III M.D.   On: 07/16/2017 14:21   Result Date: 07/16/2017 CLINICAL  DATA:  Fever with reported liver abscess EXAM: CT ABDOMEN AND PELVIS WITH CONTRAST TECHNIQUE: Multidetector CT imaging of the abdomen and pelvis was performed using the standard protocol following bolus administration of intravenous contrast. CONTRAST:  32m OMNIPAQUE IOHEXOL 300 MG/ML  SOLN COMPARISON:  None. [facility] FINDINGS: Lower chest: There are small pleural effusions bilaterally with bibasilar atelectatic change. Hepatobiliary: Liver measures 22.4 cm in length. There is diffuse hepatic steatosis. There is a fluid collection involving much of the lateral segment right lobe of the  liver measuring 8.2 x 8.0 x 5.0 cm consistent with abscess. There is no appreciable air in this area. Note that this abscess abuts a portion of the tubing from a lap band. Pancreas: No pancreatic mass or inflammatory focus. Spleen: No splenic lesions are evident. Spleen measures 14.0 x 13.8 x 11.3 cm with a measured splenic volume of 1,092 cubic cm. Adrenals/Urinary Tract: Adrenals appear unremarkable bilaterally. There is a cyst arising from the lateral mid left kidney measuring 4.0 x 3.3 cm. There is an 8 x 8 mm cyst in the lower pole of the right kidney laterally. There is no hydronephrosis on either side. There is no evident renal or ureteral calculus on either side. Urinary bladder is midline with wall thickness within normal limits. There is a small urachal remnant without associated mass or inflammatory focus. Stomach/Bowel: There is a lap band positioned at the gastric cardia. There are foci of air outside of bowel lumen immediately adjacent to portions of the tubing of this lap band in the left upper quadrant. There is no appreciable bowel wall or mesenteric thickening. There is air along the wall of the stomach near the insertion for the lap band tubing suggesting erosion from the lap band region through the wall of the stomach. No other free air seen. No portal venous air evident. No evident bowel obstruction. There are scattered colonic diverticula without diverticulitis. Vascular/Lymphatic: There are foci of aortic and left common iliac artery atherosclerosis. No evident aneurysm. No free air or portal venous air. Reproductive: There are foci of prostatic calculi. Prostate and seminal vesicles appear normal in size and contour. No pelvic masses evident. Other: Appendix appears normal. Outside of the liver, there is no evident abscess in the abdomen or pelvis. There is no appreciable ascites in the abdomen or pelvis. There is a ventral hernia containing only fat. There is fat in each in inguinal ring.  Musculoskeletal: There is degenerative change in the lower thoracic and lumbar spine regions. There is anterior wedging of several lower thoracic vertebral bodies. There is no evident blastic or lytic bone lesion. There is no intramuscular lesion evident. IMPRESSION: 1. Abscess in the left lobe of the liver measuring 8.1 x 8.0 x 5.0 cm. Currently no air is appreciable within this abscess. This abscess abuts the tubing for the lap band just to the left of midline in the left upper abdomen. 2. Lap band position in gastric cardia region. There is air tracking along the edge of the lap band into the wall the stomach and along portions of the tubing outside of bowel lumen. There appears to be erosion of the lap band causing leakage of air from the upper stomach. The amount of free air seen is slight. 3. Splenomegaly. Liver is also enlarged. There is hepatic steatosis. 4. No abscess is seen apart from the liver abscess. No ascites evident. Appendix region appears normal. There are scattered colonic diverticula without  diverticulitis. No evident bowel obstruction. 5.  No renal or ureteral calculus.  No hydronephrosis. 6.  There is aortoiliac atherosclerosis. 7. There is a ventral hernia containing only fat. There is fat in each inguinal ring. 8.  Small pleural effusions bilaterally with bibasilar atelectasis. Aortic Atherosclerosis (ICD10-I70.0). Electronically Signed: By: Lowella Grip III M.D. On: 07/16/2017 14:12   US Renal  Result Date: 07/16/2017 CLINICAL DATA:  62 year old male with acute kidney insufficiency. Subsequent encounter. EXAM: RENAL / URINARY TRACT ULTRASOUND COMPLETE COMPARISON:  07/16/2017 CT. FINDINGS: Right Kidney: Length: 12.7 cm. Echogenicity within normal limits. No mass or hydronephrosis visualized. Left Kidney: Length: 12.8 cm. Echogenicity within normal limits. No hydronephrosis. 3.3 x 3.8 x 3.2 cm lower pole cyst. Bladder: Appears normal for degree of bladder distention. IMPRESSION: No  hydronephrosis. 3.8 cm left renal cyst. Electronically Signed   By: Genia Del M.D.   On: 07/16/2017 17:28   Dg Chest Port 1 View  Result Date: 07/16/2017 CLINICAL DATA:  Increase shortness of breath, hypoxia EXAM: PORTABLE CHEST 1 VIEW COMPARISON:  Chest x-ray of 05/22/2011 FINDINGS: The lungs are not as well aerated with probable mild volume loss at the bases left-greater-than-right. No definite pneumonia or pleural effusion is seen. Cardiomegaly is stable. No bony abnormality is noted. IMPRESSION: Diminished aeration with mild basilar volume loss. No definite active process. Stable cardiomegaly. Electronically Signed   By: Ivar Drape M.D.   On: 07/16/2017 12:26   Ct Image Guided Drainage By Percutaneous Catheter  Result Date: 07/17/2017 INDICATION: Liver abscess EXAM: CT GUIDED DRAINAGE OF HEPATIC ABSCESS MEDICATIONS: The patient is currently admitted to the hospital and receiving intravenous antibiotics. The antibiotics were administered within an appropriate time frame prior to the initiation of the procedure. ANESTHESIA/SEDATION: Two mg IV Versed 100 mcg IV Fentanyl Moderate Sedation Time:  16 minutes The patient was continuously monitored during the procedure by the interventional radiology nurse under my direct supervision. COMPLICATIONS: None immediate. TECHNIQUE: Informed written consent was obtained from the patient after a thorough discussion of the procedural risks, benefits and alternatives. All questions were addressed. Maximal Sterile Barrier Technique was utilized including caps, mask, sterile gowns, sterile gloves, sterile drape, hand hygiene and skin antiseptic. A timeout was performed prior to the initiation of the procedure. PROCEDURE: The upper abdomen was prepped with ChloraPrep in a sterile fashion, and a sterile drape was applied covering the operative field. A sterile gown and sterile gloves were used for the procedure. Local anesthesia was provided with 1% Lidocaine. Under CT  guidance, an 18 gauge needle was advanced into the left lobe liver abscess. The needle was removed over an Amplatz wire. Ten [place] dilator followed by a 10 [place] drain were inserted. It was looped and string fixed in the fluid collection. Frank pus was aspirated. FINDINGS: Images demonstrate placement of a 10 French drain into a hepatic abscess in the left lobe. IMPRESSION: Successful 10 French left lobe hepatic abscess drain placement. Electronically Signed   By: Marybelle Killings M.D.   On: 07/17/2017 11:17   [place] Ekg Site Rite  Result Date: 07/17/2017 If Site Rite image not attached, placement could not be confirmed due to current cardiac rhythm.   Labs:  CBC: Recent Labs    07/15/17 2143 07/16/17 0552 07/17/17 0534 07/18/17 0547  WBC 18.2* 17.1* 18.3* 11.4*  HGB 12.8* 12.2* 13.0 12.4*  HCT 37.1* 36.4* 39.3 37.0*  PLT 341 385 419* 408*    COAGS: Recent Labs    07/15/17 2143  INR  1.18  APTT 32    BMP: Recent Labs    07/15/17 2143 07/16/17 0552 07/17/17 0534 07/18/17 0547  NA 135 138 138 141  K 3.9 3.6 4.2 4.0  CL 105 105 105 110  CO2 19* '24 24 24  ' GLUCOSE 185* 169* 124* 95  BUN 37* 41* 47* 41*  CALCIUM 7.9* 8.0* 8.2* 8.0*  CREATININE 1.97* 1.94* 1.87* 1.63*  GFRNONAA 34* 35* 36* 43*  GFRAA 40* 40* 42* 50*    LIVER FUNCTION TESTS: Recent Labs    07/15/17 2143  BILITOT 2.2*  AST 33  ALT 25  ALKPHOS 125  PROT 6.5  ALBUMIN 2.1*    Assessment and Plan: 63 y.o.male, status post laparoscopiclap bandplaced on 01/10/2010,who [facility] with left upper quadrant pain, rigors,, fever, malaise and abdominal pain. EGD showed circumferential black lap band had eroded through the wall of the stomach. Recent CT shows abscess in left hepatic lobe measuring up to 8 cm. The abscess abuts the tubing for the lap bandjust to the left of midline in the left upper abdomen; s/p left hepatic drain placement 7/2; afebrile; WBC 11.4(18.3), hgb  12.4(13), creat 1.63(1.87); drain fluid cx/blood cx- neg to date; cont with once daily irrigation of drain as OP, recording of output and dressing changes daily; he can be scheduled for f/u CT at IR drain clinic within next 1-2 weeks- our office will call pt with appt date       Electronically Signed: D. Rowe Robert, PA-C 07/18/2017, 9:55 AM   I spent a total of 15 minutes at the the patient's bedside AND on the patient's hospital floor or unit, greater than 50% of which was counseling/coordinating care for hepatic abscess drain    Patient ID: Trevor Serrano, male   DOB: [birth year], 62 y.o.   MRN: [id number]

## 2017-07-18 NOTE — Discharge Summary (Signed)
Physician Discharge Summary    Patient ID: Trevor Serrano MRN: [id number] DOB/AGE: [birth year]  62 y.o.  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  IMPRESSION: Successful 10 French left lobe hepatic abscess drain placement.   Electronically Signed   By: Marybelle Killings M.D.   On: 07/17/2017 11:17  Consults: Internal Medicine. Infectious disease  Hospital Course:   Morbidly obese male status post lap band December 2011.  Lost to follow-up with issues of noncompliance.  Went in [place] with fever.  Found to have 8 cm abscess of his liver close to his lap band.  Admitted.  Given IV antibiotics.  Percutaneous drainage done.  Infectious disease consultation made.  Recommendation made for IV Rocephin for 3 weeks.  By the time of discharge, the patient was walking well the hallways, eating food, having flatus.  Pain was well-controlled on an oral medications.  Based on meeting discharge criteria and continuing to recover, I felt it was safe for the patient to be discharged from the hospital to further recover with close followup. Postoperative recommendations were discussed in detail.  They are written as well.  Discharged Condition: good  Disposition:  Follow-up Information    Johnathan Hausen, MD Follow up in 2 week(s).   Specialty:  General Surgery Why:  To follow up after drainage of abscess near LapBand Contact information: [address] [place] [address] [phone]           Discharge disposition: 01-Home or Self Care       Discharge Instructions    Change dressing (specify)   Complete by:  As directed    Change dressing at drain site as needed.   Diet - low sodium heart healthy   Complete by:  As directed    Discharge instructions   Complete by:  As directed    Christine:   Carry a list of your medications and allergies with you at all times  Call your pharmacy at least 1 week in advance to refill prescriptions  Do not mix any prescribed pain medicine with alcohol  Do not drive any motor vehicles while taking pain medication  Take medications with food unless otherwise directed  Follow-up appointments (date to return to physician): Please call [phone] to confirm your follow up appointment with your surgeon.  Call your Surgeon if you have:  Temperature greater than 101.0  Persistent nausea and vomiting  Severe uncontrolled pain  Redness, tenderness, or signs of infection (pain, swelling, redness, odor or    green/yellow discharge around the site)  Difficulty breathing, headache or visual disturbances  Hives  Persistent dizziness or light-headedness  Any other questions or concerns you may have after discharge  In an emergency, call 911 or go to an Emergency Department at a nearby hospital.   Diet: Begin  with liquids, and if they are tolerated, resume your usual diet.  Avoid spicy, greasy or heavy foods.  If you have nausea or vomiting, go back to liquids.  If you cannot keep liquids down, call your doctor.  Avoid alcohol consumption while on prescription pain medications. Good nutrition promotes healing. Increase fiber and fluids.   ADDITIONAL INSTRUCTIONS:   Earnstine Regal, MD, [facility] Office: [place] infusion instructions [facility] May follow [place] Dosing  Protocol; May administer Cathflo as needed to maintain patency of vascular access device.; Flushing of vascular access device: per [facility] Protocol: 0.9% NaCl pre/post medica...   Complete by:  As directed    Instructions:  May follow [place] Dosing Protocol   Instructions:  May administer Cathflo as needed to maintain patency of vascular access device.   Instructions:  Flushing of vascular access device: per [facility] Protocol: 0.9% NaCl pre/post medication administration and prn patency; Heparin 100 u/ml, 58m for implanted ports and Heparin 10u/ml, 538mfor all other central venous catheters.   Instructions:  May follow [facility] Anaphylaxis Protocol for First Dose Administration in the home: 0.9% NaCl at 25-50 ml/hr to maintain IV access for protocol meds. Epinephrine 0.3 ml IV/IM PRN and Benadryl 25-50 IV/IM PRN s/s of anaphylaxis.   Instructions:  [facility] Coordinator (RN) to assist per patient IV care needs in the home PRN.   Increase activity slowly   Complete by:  As directed       Allergies as of 07/18/2017   No Known Allergies     Medication List    STOP taking these medications   clindamycin 300 MG capsule Commonly known as:  CLEOCIN     TAKE these medications   allopurinol 100 MG tablet Commonly known as:  ZYLOPRIM Take 100 mg by mouth 2 (two) times daily.   amLODipine 10 MG tablet Commonly known as:  NORVASC Take 10 mg by mouth daily.   aspirin EC 81 MG tablet Take 81 mg by mouth daily.   buPROPion 100 MG tablet Commonly known as:  WELLBUTRIN Take 100 mg by mouth 2 (two) times daily.   cefTRIAXone IVPB Commonly known as:  ROCEPHIN Inject 2 g into the vein daily for 18 days. Indication:  Liver abscess Last Day of Therapy:  08/04/17 Labs - Once weekly:  CBC/D and BMP, Labs - Every other week:  ESR and CRP   cetirizine 10 MG tablet Commonly known as:  ZYRTEC Take 10 mg by mouth daily.   esomeprazole 40 MG capsule Commonly known as:  NEXIUM Take 40 mg  by mouth daily at 12 noon.   ferrous sulfate 325 (65 FE) MG tablet Take 325 mg by mouth daily with breakfast.   FISH OIL PO Take 1,000 mg by mouth daily.   hydrochlorothiazide 12.5 MG capsule Commonly known as:  MICROZIDE Take 12.5 mg by mouth daily.   indomethacin 50 MG capsule Commonly known as:  INDOCIN Take 50 mg by mouth 2 (two) times daily as needed for mild pain or moderate pain.   indomethacin 25 MG capsule Commonly known as:  INDOCIN Take 1 capsule (25 mg total) by mouth 3 (three) times daily as needed.   metoprolol tartrate 50 MG tablet Commonly known as:  LOPRESSOR Take 50 mg by mouth daily.   multivitamin with minerals Tabs tablet Take 1 tablet by mouth daily.   rosuvastatin 5 MG tablet Commonly known as:  CRESTOR Take 5 mg  by mouth daily.   telmisartan 40 MG tablet Commonly known as:  MICARDIS Take 40 mg by mouth daily.            Home Infusion Instuctions  (From admission, onward)        Start     Ordered   07/18/17 0000  Home infusion instructions [facility] May follow [place] Dosing Protocol; May administer Cathflo as needed to maintain patency of vascular access device.; Flushing of vascular access device: per [facility] Protocol: 0.9% NaCl pre/post medica...    Question Answer Comment  Instructions May follow [place] Dosing Protocol   Instructions May administer Cathflo as needed to maintain patency of vascular access device.   Instructions Flushing of vascular access device: per [facility] Protocol: 0.9% NaCl pre/post medication administration and prn patency; Heparin 100 u/ml, 77m for implanted ports and Heparin 10u/ml, 549mfor all other central venous catheters.   Instructions May follow [facility] Anaphylaxis Protocol for First Dose Administration in the home: 0.9% NaCl at 25-50 ml/hr to maintain IV access for protocol meds. Epinephrine 0.3 ml IV/IM PRN and Benadryl 25-50 IV/IM PRN s/s of anaphylaxis.   Instructions [facility] Infusion  Coordinator (RN) to assist per patient IV care needs in the home PRN.      07/18/17 0914       Discharge Care Instructions  (From admission, onward)        Start     Ordered   07/18/17 0000  Change dressing (specify)    Comments:  Change dressing at drain site as needed.   07/18/17 0917      Significant Diagnostic Studies:  Results for orders placed or performed during the hospital encounter of 07/15/17 (from the past 72 hour(s))  Culture, blood (routine x 2)     Status: None (Preliminary result)   Collection Time: 07/15/17  9:42 PM  Result Value Ref Range   Specimen Description BLOOD LEFT HAND    Special Requests      BOTTLES DRAWN AEROBIC AND ANAEROBIC Blood Culture adequate volume Performed at [facility] [place] [id number]  Culture      NO GROWTH 2 DAYS Performed at [facility] [place] [id number]  Report Status PENDING   HIV antibody (Routine Testing)     Status: None   Collection Time: 07/15/17  9:43 PM  Result Value Ref Range   HIV Screen 4th Generation wRfx Non Reactive Non Reactive    Comment: (NOTE) Performed At: [facility]4[id number] FarmerD PhTD:[id number] at [facility] [place] [address] Comprehensive metabolic panel     Status: Abnormal   Collection Time: 07/15/17  9:43 PM  Result Value Ref Range   Sodium 135 135 - 145 mmol/L   Potassium 3.9 3.5 - 5.1 mmol/L   Chloride 105 98 - 111 mmol/L    Comment: Please note change in reference range.   CO2 19 (L) 22 - 32 mmol/L   Glucose, Bld 185 (H) 70 - 99 mg/dL    Comment: Please note change in reference range.   BUN 37 (H) 8 - 23 mg/dL    Comment: Please note change in reference range.   Creatinine, Ser 1.97 (H) 0.61 - 1.24 mg/dL   Calcium 7.9 (L) 8.9 - 10.3 mg/dL   Total Protein 6.5 6.5 -  8.1 g/dL   Albumin 2.1 (L)  3.5 - 5.0 g/dL   AST 33 15 - 41 U/L   ALT 25 0 - 44 U/L    Comment: Please note change in reference range.   Alkaline Phosphatase 125 38 - 126 U/L   Total Bilirubin 2.2 (H) 0.3 - 1.2 mg/dL   GFR calc non Af Amer 34 (L) >60 mL/min   GFR calc Af Amer 40 (L) >60 mL/min    Comment: (NOTE) The eGFR has been calculated using the CKD EPI equation. This calculation has not been validated in all clinical situations. eGFR's persistently <60 mL/min signify possible Chronic Kidney Disease.    Anion gap 11 5 - 15    Comment: Performed at [facility], [place] [address] [place], [place] [address]  Brain natriuretic peptide     Status: Abnormal   Collection Time: 07/15/17  9:43 PM  Result Value Ref Range   B Natriuretic Peptide 146.3 (H) 0.0 - 100.0 pg/mL    Comment: Performed at [facility], [place] [address] [place], [place] [address]  TSH     Status: None   Collection Time: 07/15/17  9:43 PM  Result Value Ref Range   TSH 0.720 0.350 - 4.500 uIU/mL    Comment: Performed by a 3rd Generation assay with a functional sensitivity of <=0.01 uIU/mL. Performed at [facility], [place] [address] [place], [place] [address]   Magnesium     Status: None   Collection Time: 07/15/17  9:43 PM  Result Value Ref Range   Magnesium 2.0 1.7 - 2.4 mg/dL    Comment: Performed at [facility], [place] [address], [place], [place] [address]  Phosphorus     Status: None   Collection Time: 07/15/17  9:43 PM  Result Value Ref Range   Phosphorus 4.3 2.5 - 4.6 mg/dL    Comment: Performed at [facility], [place] [address] [place], [place] [address]  Protime-INR     Status: None   Collection Time: 07/15/17  9:43 PM  Result Value Ref Range   Prothrombin Time 14.9 11.4 - 15.2 seconds   INR 1.18     Comment: Performed at [facility], [place] [address], [place], [place] [address]  APTT     Status: None   Collection  Time: 07/15/17  9:43 PM  Result Value Ref Range   aPTT 32 24 - 36 seconds    Comment: Performed at [facility], [place] [address] [place],  [address]  CBC WITH DIFFERENTIAL     Status: Abnormal   Collection Time: 07/15/17  9:43 PM  Result Value Ref Range   WBC 18.2 (H) 4.0 - 10.5 K/uL   RBC 4.07 (L) 4.22 - 5.81 MIL/uL   Hemoglobin 12.8 (L) 13.0 - 17.0 g/dL   HCT 37.1 (L) 39.0 - 52.0 %   MCV 91.2 78.0 - 100.0 fL   MCH 31.4 26.0 - 34.0 pg   MCHC 34.5 30.0 - 36.0 g/dL   RDW 14.5 11.5 - 15.5 %   Platelets 341 150 - 400 K/uL   Neutrophils Relative % 92 %   Neutro Abs 16.6 (H) 1.7 - 7.7 K/uL   Lymphocytes Relative 5 %   Lymphs Abs 0.9 0.7 - 4.0 K/uL   Monocytes Relative 3 %   Monocytes Absolute 0.6 0.1 - 1.0 K/uL   Eosinophils Relative 0 %   Eosinophils Absolute 0.0 0.0 - 0.7  K/uL   Basophils Relative 0 %   Basophils Absolute 0.0 0.0 - 0.1 K/uL    Comment: Performed at [facility], [place] [address] [place], [place] [address]  Culture, blood (routine x 2)     Status: None (Preliminary result)   Collection Time: 07/15/17  9:43 PM  Result Value Ref Range   Specimen Description BLOOD RIGHT HAND    Special Requests      BOTTLES DRAWN AEROBIC AND ANAEROBIC Blood Culture adequate volume Performed at [place] [address] [place], [place] [address]    Culture      NO GROWTH 2 DAYS Performed at [facility], [place] [address] [place], [place] [address]    Report Status PENDING   Basic metabolic panel     Status: Abnormal   Collection Time: 07/16/17  5:52 AM  Result Value Ref Range   Sodium 138 135 - 145 mmol/L   Potassium 3.6 3.5 - 5.1 mmol/L   Chloride 105 98 - 111 mmol/L    Comment: Please note change in reference range.   CO2 24 22 - 32 mmol/L   Glucose, Bld 169 (H) 70 - 99 mg/dL    Comment: Please note change in reference range.   BUN 41 (H) 8 - 23 mg/dL    Comment: Please note change in reference  range.   Creatinine, Ser 1.94 (H) 0.61 - 1.24 mg/dL   Calcium 8.0 (L) 8.9 - 10.3 mg/dL   GFR calc non Af Amer 35 (L) >60 mL/min   GFR calc Af Amer 40 (L) >60 mL/min    Comment: (NOTE) The eGFR has been calculated using the CKD EPI equation. This calculation has not been validated in all clinical situations. eGFR's persistently <60 mL/min signify possible Chronic Kidney Disease.    Anion gap 9 5 - 15    Comment: Performed at [facility], [place] [address] [place], [place] [address]  CBC     Status: Abnormal   Collection Time: 07/16/17  5:52 AM  Result Value Ref Range   WBC 17.1 (H) 4.0 - 10.5 K/uL   RBC 3.97 (L) 4.22 - 5.81 MIL/uL   Hemoglobin 12.2 (L) 13.0 - 17.0 g/dL   HCT 36.4 (L) 39.0 - 52.0 %   MCV 91.7 78.0 - 100.0 fL   MCH 30.7 26.0 - 34.0 pg   MCHC 33.5 30.0 - 36.0 g/dL   RDW 14.6 11.5 - 15.5 %   Platelets 385 150 - 400 K/uL    Comment: Performed at [facility], [place] [address], [place], [place] [address]  Hemoglobin A1c     Status: Abnormal   Collection Time: 07/16/17  5:52 AM  Result Value Ref Range   Hgb A1c MFr Bld 6.6 (H) 4.8 - 5.6 %    Comment: (NOTE) Pre diabetes:          5.7%-6.4% Diabetes:              >6.4% Glycemic control for   <7.0% adults with diabetes    Mean Plasma Glucose 142.72 mg/dL    Comment: Performed at [place] [address], [place], [place] [address]  Aerobic/Anaerobic Culture (surgical/deep wound)     Status: None (Preliminary result)   Collection Time: 07/16/17  5:06 PM  Result Value Ref Range   Specimen Description      ABSCESS LIVER Performed at [facility], [place] [address], [place], [place]  [address]    Special Requests      NONE Performed at [facility], [place] [address] [place], [place] [address]    Gram Stain      ABUNDANT WBC PRESENT, PREDOMINANTLY PMN ABUNDANT GRAM POSITIVE COCCI    Culture      CULTURE REINCUBATED FOR BETTER GROWTH HOLDING FOR  POSSIBLE ANAEROBE Performed at [facility], [place] [address], [place], [place] [address]    Report Status PENDING   CBC     Status: Abnormal   Collection Time: 07/17/17  5:34 AM  Result Value Ref Range   WBC 18.3 (H) 4.0 - 10.5 K/uL   RBC 4.29 4.22 - 5.81 MIL/uL   Hemoglobin 13.0 13.0 - 17.0 g/dL   HCT 39.3 39.0 - 52.0 %   MCV 91.6 78.0 - 100.0 fL   MCH 30.3 26.0 - 34.0 pg   MCHC 33.1 30.0 - 36.0 g/dL   RDW 15.0 11.5 - 15.5 %   Platelets 419 (H) 150 - 400 K/uL    Comment: Performed at [facility], [place] [address] [place], [place] [address]  Basic metabolic panel     Status: Abnormal   Collection Time: 07/17/17  5:34 AM  Result Value Ref Range   Sodium 138 135 - 145 mmol/L   Potassium 4.2 3.5 - 5.1 mmol/L   Chloride 105 98 - 111 mmol/L    Comment: Please note change in reference range.   CO2 24 22 - 32 mmol/L   Glucose, Bld 124 (H) 70 - 99 mg/dL    Comment: Please note change in reference range.   BUN 47 (H) 8 - 23 mg/dL    Comment: Please note change in reference range.   Creatinine, Ser 1.87 (H) 0.61 - 1.24 mg/dL   Calcium 8.2 (L) 8.9 - 10.3 mg/dL   GFR calc non Af Amer 36 (L) >60 mL/min   GFR calc Af Amer 42 (L) >60 mL/min    Comment: (NOTE) The eGFR has been calculated using the CKD EPI equation. This calculation has not been validated in all clinical situations. eGFR's persistently <60 mL/min signify possible Chronic Kidney Disease.    Anion gap 9 5 - 15    Comment: Performed at [facility], [place] [address], [place], [place] [address]  Hemoglobin A1c     Status: Abnormal   Collection Time: 07/17/17  5:34 AM  Result Value Ref Range   Hgb A1c MFr Bld 6.5 (H) 4.8 - 5.6 %    Comment: (NOTE) Pre diabetes:          5.7%-6.4% Diabetes:              >6.4% Glycemic control for   <7.0% adults with diabetes    Mean Plasma Glucose 139.85 mg/dL    Comment: Performed at [place] [address], [place], [place] [address]   Hepatitis C antibody     Status: None   Collection Time: 07/17/17  2:27 PM  Result Value Ref Range   HCV Ab <0.1 0.0 - 0.9 s/co ratio    Comment: (NOTE)                                  Negative:     < 0.8  Indeterminate: 0.8 - 0.9                                  Positive:     > 0.9 The CDC recommends that a positive HCV antibody result be followed up with a HCV Nucleic Acid Amplification test (628315). Performed At: [facility] [place], [place] [id number] Rush Farmer MD [id number] Performed at [facility], [place] [address] [place], [place] [address]   CBC     Status: Abnormal   Collection Time: 07/18/17  5:47 AM  Result Value Ref Range   WBC 11.4 (H) 4.0 - 10.5 K/uL   RBC 4.07 (L) 4.22 - 5.81 MIL/uL   Hemoglobin 12.4 (L) 13.0 - 17.0 g/dL   HCT 37.0 (L) 39.0 - 52.0 %   MCV 90.9 78.0 - 100.0 fL   MCH 30.5 26.0 - 34.0 pg   MCHC 33.5 30.0 - 36.0 g/dL   RDW 15.1 11.5 - 15.5 %   Platelets 408 (H) 150 - 400 K/uL    Comment: Performed at [facility], [place] [address], [place], [place] [address]  Basic metabolic panel     Status: Abnormal   Collection Time: 07/18/17  5:47 AM  Result Value Ref Range   Sodium 141 135 - 145 mmol/L   Potassium 4.0 3.5 - 5.1 mmol/L   Chloride 110 98 - 111 mmol/L    Comment: Please note change in reference range.   CO2 24 22 - 32 mmol/L   Glucose, Bld 95 70 - 99 mg/dL    Comment: Please note change in reference range.   BUN 41 (H) 8 - 23 mg/dL    Comment: Please note change in reference range.   Creatinine, Ser 1.63 (H) 0.61 - 1.24 mg/dL   Calcium 8.0 (L) 8.9 - 10.3 mg/dL   GFR calc non Af Amer 43 (L) >60 mL/min   GFR calc Af Amer 50 (L) >60 mL/min    Comment: (NOTE) The eGFR has been calculated using the CKD EPI equation. This calculation has not been validated in all clinical situations. eGFR's persistently <60 mL/min signify possible Chronic  Kidney Disease.    Anion gap 7 5 - 15    Comment: Performed at [facility], [place] [address], [place], [place] [address]    Ct Abdomen Pelvis W Contrast  Addendum Date: 07/16/2017   ADDENDUM REPORT: 07/16/2017 14:21 ADDENDUM: Critical Value/emergent results were called by telephone at the time of interpretation on 07/16/2017 at 2:21 pm to Dr. Marlou Starks, General Surgery, who verbally acknowledged these results. Electronically Signed   By: Lowella Grip III M.D.   On: 07/16/2017 14:21   Result Date: 07/16/2017 CLINICAL DATA:  Fever with reported liver abscess EXAM: CT ABDOMEN AND PELVIS WITH CONTRAST TECHNIQUE: Multidetector CT imaging of the abdomen and pelvis was performed using the standard protocol following bolus administration of intravenous contrast. CONTRAST:  74m OMNIPAQUE IOHEXOL 300 MG/ML  SOLN COMPARISON:  None. [facility] FINDINGS: Lower chest: There are small pleural effusions bilaterally with bibasilar atelectatic change. Hepatobiliary: Liver measures 22.4 cm in length. There is diffuse hepatic steatosis. There is a fluid collection involving much of the lateral segment right lobe of the liver measuring 8.2 x 8.0 x 5.0 cm consistent with abscess. There is no appreciable air in this area. Note that this abscess  abuts a portion of the tubing from a lap band. Pancreas: No pancreatic mass or inflammatory focus. Spleen: No splenic lesions are evident. Spleen measures 14.0 x 13.8 x 11.3 cm with a measured splenic volume of 1,092 cubic cm. Adrenals/Urinary Tract: Adrenals appear unremarkable bilaterally. There is a cyst arising from the lateral mid left kidney measuring 4.0 x 3.3 cm. There is an 8 x 8 mm cyst in the lower pole of the right kidney laterally. There is no hydronephrosis on either side. There is no evident renal or ureteral calculus on either side. Urinary bladder is midline with wall thickness within  normal limits. There is a small urachal remnant without associated mass or inflammatory focus. Stomach/Bowel: There is a lap band positioned at the gastric cardia. There are foci of air outside of bowel lumen immediately adjacent to portions of the tubing of this lap band in the left upper quadrant. There is no appreciable bowel wall or mesenteric thickening. There is air along the wall of the stomach near the insertion for the lap band tubing suggesting erosion from the lap band region through the wall of the stomach. No other free air seen. No portal venous air evident. No evident bowel obstruction. There are scattered colonic diverticula without diverticulitis. Vascular/Lymphatic: There are foci of aortic and left common iliac artery atherosclerosis. No evident aneurysm. No free air or portal venous air. Reproductive: There are foci of prostatic calculi. Prostate and seminal vesicles appear normal in size and contour. No pelvic masses evident. Other: Appendix appears normal. Outside of the liver, there is no evident abscess in the abdomen or pelvis. There is no appreciable ascites in the abdomen or pelvis. There is a ventral hernia containing only fat. There is fat in each in inguinal ring. Musculoskeletal: There is degenerative change in the lower thoracic and lumbar spine regions. There is anterior wedging of several lower thoracic vertebral bodies. There is no evident blastic or lytic bone lesion. There is no intramuscular lesion evident. IMPRESSION: 1. Abscess in the left lobe of the liver measuring 8.1 x 8.0 x 5.0 cm. Currently no air is appreciable within this abscess. This abscess abuts the tubing for the lap band just to the left of midline in the left upper abdomen. 2. Lap band position in gastric cardia region. There is air tracking along the edge of the lap band into the wall the stomach and along portions of the tubing outside of bowel lumen. There appears to be erosion of the lap band causing leakage  of air from the upper stomach. The amount of free air seen is slight. 3. Splenomegaly. Liver is also enlarged. There is hepatic steatosis. 4. No abscess is seen apart from the liver abscess. No ascites evident. Appendix region appears normal. There are scattered colonic diverticula without diverticulitis. No evident bowel obstruction. 5.  No renal or ureteral calculus.  No hydronephrosis. 6.  There is aortoiliac atherosclerosis. 7. There is a ventral hernia containing only fat. There is fat in each inguinal ring. 8.  Small pleural effusions bilaterally with bibasilar atelectasis. Aortic Atherosclerosis (ICD10-I70.0). Electronically Signed: By: Lowella Grip III M.D. On: 07/16/2017 14:12   US Renal  Result Date: 07/16/2017 CLINICAL DATA:  62 year old male with acute kidney insufficiency. Subsequent encounter. EXAM: RENAL / URINARY TRACT ULTRASOUND COMPLETE COMPARISON:  07/16/2017 CT. FINDINGS: Right Kidney: Length: 12.7 cm. Echogenicity within normal limits. No mass or hydronephrosis visualized. Left Kidney: Length: 12.8 cm. Echogenicity within normal limits. No hydronephrosis. 3.3 x 3.8 x 3.2  cm lower pole cyst. Bladder: Appears normal for degree of bladder distention. IMPRESSION: No hydronephrosis. 3.8 cm left renal cyst. Electronically Signed   By: Genia Del M.D.   On: 07/16/2017 17:28   Dg Chest Port 1 View  Result Date: 07/16/2017 CLINICAL DATA:  Increase shortness of breath, hypoxia EXAM: PORTABLE CHEST 1 VIEW COMPARISON:  Chest x-ray of 05/22/2011 FINDINGS: The lungs are not as well aerated with probable mild volume loss at the bases left-greater-than-right. No definite pneumonia or pleural effusion is seen. Cardiomegaly is stable. No bony abnormality is noted. IMPRESSION: Diminished aeration with mild basilar volume loss. No definite active process. Stable cardiomegaly. Electronically Signed   By: Ivar Drape M.D.   On: 07/16/2017 12:26   Ct Image Guided Drainage By Percutaneous  Catheter  Result Date: 07/17/2017 INDICATION: Liver abscess EXAM: CT GUIDED DRAINAGE OF HEPATIC ABSCESS MEDICATIONS: The patient is currently admitted to the hospital and receiving intravenous antibiotics. The antibiotics were administered within an appropriate time frame prior to the initiation of the procedure. ANESTHESIA/SEDATION: Two mg IV Versed 100 mcg IV Fentanyl Moderate Sedation Time:  16 minutes The patient was continuously monitored during the procedure by the interventional radiology nurse under my direct supervision. COMPLICATIONS: None immediate. TECHNIQUE: Informed written consent was obtained from the patient after a thorough discussion of the procedural risks, benefits and alternatives. All questions were addressed. Maximal Sterile Barrier Technique was utilized including caps, mask, sterile gowns, sterile gloves, sterile drape, hand hygiene and skin antiseptic. A timeout was performed prior to the initiation of the procedure. PROCEDURE: The upper abdomen was prepped with ChloraPrep in a sterile fashion, and a sterile drape was applied covering the operative field. A sterile gown and sterile gloves were used for the procedure. Local anesthesia was provided with 1% Lidocaine. Under CT guidance, an 18 gauge needle was advanced into the left lobe liver abscess. The needle was removed over an Amplatz wire. Ten [place] dilator followed by a 10 [place] drain were inserted. It was looped and string fixed in the fluid collection. Frank pus was aspirated. FINDINGS: Images demonstrate placement of a 10 French drain into a hepatic abscess in the left lobe. IMPRESSION: Successful 10 French left lobe hepatic abscess drain placement. Electronically Signed   By: Marybelle Killings M.D.   On: 07/17/2017 11:17   [place] Ekg Site Rite  Result Date: 07/17/2017 If Site Rite image not attached, placement could not be confirmed due to current cardiac rhythm.   Discharge Exam: Blood pressure (!) 108/55, pulse 64, temperature  98 F (36.7 C), temperature source Oral, resp. rate 20, height '5\' 8"'  (1.727 m), weight (!) 150.2 kg (331 lb 1.6 oz), SpO2 95 %.  Physical Exam: HEENT - sclerae clear, mucous membranes moist Neck - soft Abdomen - drain with thin, tan fluid in bulb Ext - no edema, non-tender Neuro - alert & oriented, no focal deficits    Past Medical History:  Diagnosis Date  . Diabetes mellitus without complication ([place])   . Gout     Past Surgical History:  Procedure Laterality Date  . GASTRIC BYPASS      Social History   Socioeconomic History  . Marital status: Divorced    Spouse name: Not on file  . Number of children: Not on file  . Years of education: Not on file  . Highest education level: Not on file  Occupational History  . Not on file  Social Needs  . Financial resource strain: Not on file  .  Food insecurity:    Worry: Not on file    Inability: Not on file  . Transportation needs:    Medical: Not on file    Non-medical: Not on file  Tobacco Use  . Smoking status: Current Every Day Smoker    Packs/day: 1.00    Types: Cigarettes  Substance and Sexual Activity  . Alcohol use: No  . Drug use: No  . Sexual activity: Never  Lifestyle  . Physical activity:    Days per week: Not on file    Minutes per session: Not on file  . Stress: Not on file  Relationships  . Social connections:    Talks on phone: Not on file    Gets together: Not on file    Attends religious service: Not on file    Active member of club or organization: Not on file    Attends meetings of clubs or organizations: Not on file    Relationship status: Not on file  . Intimate partner violence:    Fear of current or ex partner: Not on file    Emotionally abused: Not on file    Physically abused: Not on file    Forced sexual activity: Not on file  Other Topics Concern  . Not on file  Social History Narrative  . Not on file    History reviewed. No pertinent family history.  Current  Facility-Administered Medications  Medication Dose Route Frequency Provider Last Rate Last Dose  . allopurinol (ZYLOPRIM) tablet 100 mg  100 mg Oral BID Charlynne Cousins, MD   100 mg at 07/18/17 0912  . amLODipine (NORVASC) tablet 10 mg  10 mg Oral Daily Patrecia Pour, Christean Grief, MD   10 mg at 07/17/17 0933  . buPROPion [facility]) tablet 100 mg  100 mg Oral BID Charlynne Cousins, MD   100 mg at 07/18/17 0911  . cefTRIAXone (ROCEPHIN) 2 g in sodium chloride 0.9 % 100 mL IVPB  2 g Intravenous Q24H Campbell Riches, MD   Stopped at 07/18/17 1330  . enoxaparin (LOVENOX) injection 75 mg  0.5 mg/kg Subcutaneous Q24H Earnstine Regal, PA-C   75 mg at 07/17/17 1133  . hydrALAZINE (APRESOLINE) injection 10 mg  10 mg Intravenous Q2H PRN Johnathan Hausen, MD      . hydrochlorothiazide (MICROZIDE) capsule 12.5 mg  12.5 mg Oral Daily Charlynne Cousins, MD      . iopamidol (ISOVUE-300) 61 % injection 15 mL  15 mL Oral Once PRN Johnathan Hausen, MD      . metoprolol tartrate (LOPRESSOR) tablet 50 mg  50 mg Oral Daily Johnathan Hausen, MD   50 mg at 07/17/17 0932  . morphine 2 MG/ML injection 1 mg  1 mg Intravenous Q1H PRN Johnathan Hausen, MD   2 mg at 07/16/17 2100  . ondansetron (ZOFRAN-ODT) disintegrating tablet 4 mg  4 mg Oral Q6H PRN Johnathan Hausen, MD       Or  . ondansetron [facility]) injection 4 mg  4 mg Intravenous Q6H PRN Johnathan Hausen, MD      . pantoprazole (PROTONIX) EC tablet 40 mg  40 mg Oral BID Earnstine Regal, PA-C   40 mg at 07/18/17 0911  . sodium chloride flush (NS) 0.9 % injection 10-40 mL  10-40 mL Intracatheter PRN Armandina Gemma, MD   10 mL at 07/18/17 1353  . sodium chloride flush (NS) 0.9 % injection 5 mL  5 mL Intracatheter Q8H Hoss, Arthur, MD   5 mL at 07/18/17  1332  . tamsulosin (FLOMAX) capsule 0.4 mg  0.4 mg Oral QPC supper Earnstine Regal, PA-C   0.4 mg at 07/17/17 1707     No Known Allergies  Signed: Morton Peters, MD, FACS,  [facility]    [address], [place], [place] [address] [phone] Main / Paging [phone] Fax   07/18/2017, 2:57 PM

## 2017-07-18 NOTE — Progress Notes (Signed)
[facility] PROGRESS NOTE    Progress Note  Trevor Serrano  ZOX:[id number] DOB: 11/17/1955 DOA: 07/15/2017 PCP: Catha Gosselin, MD     Brief Narrative:   Trevor Serrano is an 62 y.o. male past medical history of morbid obesity, gout, essential hypertension underwent gastric bypass 10 years ago who presented to [place] on 07/14/2017 complaining of abdominal pain she was noted to be febrile with an elevated white count CT was performed which showed fatty liver and possible hepatic abscesses, she was started empirically on IV Flagyl and ertapenem ID was consulted who recommended to switch to IV Vanco and Zosyn and Flagyl, Dr. Daphine Deutscher was consulted over from her initial surgery and recommended transfer to their service to [facility] medical team was consulted due to bacteremia with 2 out of 2 blood cultures growing gram-positive cocci    Assessment/Plan:   Sepsis due to liver mass, left lobe 2 out of 2 blood cultures grew gram-positive cocci patient currently on IV Rocephin. ID was consulted recommended PICC line and continue Rocephin follow-up with them as an outpatient. Going to be placed today can be DC'd today we will sign off.  AKI: Resolved with IV fluid hydration KVO IV fluids. Renal ultrasound and showed no hydronephrosis, no signs of chronic renal disease.  Gastric band erosion: Continue management per surgery  Hypoxia at [facility]: BNP of 146, physical exam unreliable, chest x-ray shows no signs of fluid overload. 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  Acute urinary retention: He was started on Flomax Foley has been removed  Essential hypertension: Can resume all of his high antihypertensive medication except ARB which he can resume in a week after discharge.    DVT prophylaxis: lovenxo Family Communication:none Disposition Plan/Barrier to D/C: home once we obtain records from  [facility] ans change him to oral Code Status:     Code Status Orders  (From admission, onward)        Start     Ordered   07/15/17 2121  Full code  Continuous     07/15/17 2121    Code Status History    This patient has a current code status but no historical code status.    Advance Directive Documentation     Most Recent Value  Type of Advance Directive  Out of facility DNR (pink MOST or yellow form)  Pre-existing out of facility DNR order (yellow form or pink MOST form)  -  "MOST" Form in Place?  -        IV Access:    Peripheral IV   Procedures and diagnostic studies:   Ct Abdomen Pelvis W Contrast  Addendum Date: 07/16/2017   ADDENDUM REPORT: 07/16/2017 14:21 ADDENDUM: Critical Value/emergent results were called by telephone at the time of interpretation on 07/16/2017 at 2:21 pm to Dr. Carolynne Edouard, General Surgery, who verbally acknowledged these results. Electronically Signed   By: Bretta Bang III M.D.   On: 07/16/2017 14:21   Result Date: 07/16/2017 CLINICAL DATA:  Fever with reported liver abscess EXAM: CT ABDOMEN AND PELVIS WITH CONTRAST TECHNIQUE: Multidetector CT imaging of the abdomen and pelvis was performed using the standard protocol following bolus administration of intravenous contrast. CONTRAST:  75mL OMNIPAQUE IOHEXOL 300 MG/ML  SOLN COMPARISON:  None. Reported recent CT from [facility] not available currently to compare. FINDINGS: Lower chest: There are small pleural effusions bilaterally with bibasilar atelectatic change. Hepatobiliary: Liver measures 22.4 cm in length.  There is diffuse hepatic steatosis. There is a fluid collection involving much of the lateral segment right lobe of the liver measuring 8.2 x 8.0 x 5.0 cm consistent with abscess. There is no appreciable air in this area. Note that this abscess abuts a portion of the tubing from a lap band. Pancreas: No pancreatic mass or inflammatory focus. Spleen: No splenic lesions are evident. Spleen  measures 14.0 x 13.8 x 11.3 cm with a measured splenic volume of 1,092 cubic cm. Adrenals/Urinary Tract: Adrenals appear unremarkable bilaterally. There is a cyst arising from the lateral mid left kidney measuring 4.0 x 3.3 cm. There is an 8 x 8 mm cyst in the lower pole of the right kidney laterally. There is no hydronephrosis on either side. There is no evident renal or ureteral calculus on either side. Urinary bladder is midline with wall thickness within normal limits. There is a small urachal remnant without associated mass or inflammatory focus. Stomach/Bowel: There is a lap band positioned at the gastric cardia. There are foci of air outside of bowel lumen immediately adjacent to portions of the tubing of this lap band in the left upper quadrant. There is no appreciable bowel wall or mesenteric thickening. There is air along the wall of the stomach near the insertion for the lap band tubing suggesting erosion from the lap band region through the wall of the stomach. No other free air seen. No portal venous air evident. No evident bowel obstruction. There are scattered colonic diverticula without diverticulitis. Vascular/Lymphatic: There are foci of aortic and left common iliac artery atherosclerosis. No evident aneurysm. No free air or portal venous air. Reproductive: There are foci of prostatic calculi. Prostate and seminal vesicles appear normal in size and contour. No pelvic masses evident. Other: Appendix appears normal. Outside of the liver, there is no evident abscess in the abdomen or pelvis. There is no appreciable ascites in the abdomen or pelvis. There is a ventral hernia containing only fat. There is fat in each in inguinal ring. Musculoskeletal: There is degenerative change in the lower thoracic and lumbar spine regions. There is anterior wedging of several lower thoracic vertebral bodies. There is no evident blastic or lytic bone lesion. There is no intramuscular lesion evident. IMPRESSION: 1.  Abscess in the left lobe of the liver measuring 8.1 x 8.0 x 5.0 cm. Currently no air is appreciable within this abscess. This abscess abuts the tubing for the lap band just to the left of midline in the left upper abdomen. 2. Lap band position in gastric cardia region. There is air tracking along the edge of the lap band into the wall the stomach and along portions of the tubing outside of bowel lumen. There appears to be erosion of the lap band causing leakage of air from the upper stomach. The amount of free air seen is slight. 3. Splenomegaly. Liver is also enlarged. There is hepatic steatosis. 4. No abscess is seen apart from the liver abscess. No ascites evident. Appendix region appears normal. There are scattered colonic diverticula without diverticulitis. No evident bowel obstruction. 5.  No renal or ureteral calculus.  No hydronephrosis. 6.  There is aortoiliac atherosclerosis. 7. There is a ventral hernia containing only fat. There is fat in each inguinal ring. 8.  Small pleural effusions bilaterally with bibasilar atelectasis. Aortic Atherosclerosis (ICD10-I70.0). Electronically Signed: By: Bretta Bang III M.D. On: 07/16/2017 14:12   US Renal  Result Date: 07/16/2017 CLINICAL DATA:  61 year old male with acute kidney insufficiency. Subsequent  encounter. EXAM: RENAL / URINARY TRACT ULTRASOUND COMPLETE COMPARISON:  07/16/2017 CT. FINDINGS: Right Kidney: Length: 12.7 cm. Echogenicity within normal limits. No mass or hydronephrosis visualized. Left Kidney: Length: 12.8 cm. Echogenicity within normal limits. No hydronephrosis. 3.3 x 3.8 x 3.2 cm lower pole cyst. Bladder: Appears normal for degree of bladder distention. IMPRESSION: No hydronephrosis. 3.8 cm left renal cyst. Electronically Signed   By: Lacy Duverney M.D.   On: 07/16/2017 17:28   Dg Chest Port 1 View  Result Date: 07/16/2017 CLINICAL DATA:  Increase shortness of breath, hypoxia EXAM: PORTABLE CHEST 1 VIEW COMPARISON:  Chest x-ray of  05/22/2011 FINDINGS: The lungs are not as well aerated with probable mild volume loss at the bases left-greater-than-right. No definite pneumonia or pleural effusion is seen. Cardiomegaly is stable. No bony abnormality is noted. IMPRESSION: Diminished aeration with mild basilar volume loss. No definite active process. Stable cardiomegaly. Electronically Signed   By: Dwyane Dee M.D.   On: 07/16/2017 12:26   Ct Image Guided Drainage By Percutaneous Catheter  Result Date: 07/17/2017 INDICATION: Liver abscess EXAM: CT GUIDED DRAINAGE OF HEPATIC ABSCESS MEDICATIONS: The patient is currently admitted to the hospital and receiving intravenous antibiotics. The antibiotics were administered within an appropriate time frame prior to the initiation of the procedure. ANESTHESIA/SEDATION: Two mg IV Versed 100 mcg IV Fentanyl Moderate Sedation Time:  16 minutes The patient was continuously monitored during the procedure by the interventional radiology nurse under my direct supervision. COMPLICATIONS: None immediate. TECHNIQUE: Informed written consent was obtained from the patient after a thorough discussion of the procedural risks, benefits and alternatives. All questions were addressed. Maximal Sterile Barrier Technique was utilized including caps, mask, sterile gowns, sterile gloves, sterile drape, hand hygiene and skin antiseptic. A timeout was performed prior to the initiation of the procedure. PROCEDURE: The upper abdomen was prepped with ChloraPrep in a sterile fashion, and a sterile drape was applied covering the operative field. A sterile gown and sterile gloves were used for the procedure. Local anesthesia was provided with 1% Lidocaine. Under CT guidance, an 18 gauge needle was advanced into the left lobe liver abscess. The needle was removed over an Amplatz wire. Ten [place] dilator followed by a 10 [place] drain were inserted. It was looped and string fixed in the fluid collection. Frank pus was aspirated.  FINDINGS: Images demonstrate placement of a 10 French drain into a hepatic abscess in the left lobe. IMPRESSION: Successful 10 French left lobe hepatic abscess drain placement. Electronically Signed   By: Jolaine Click M.D.   On: 07/17/2017 11:17   [place] Ekg Site Rite  Result Date: 07/17/2017 If Site Rite image not attached, placement could not be confirmed due to current cardiac rhythm.    Medical Consultants:    None.  Anti-Infectives:   IV Rocephin.  Subjective:    STEPHONE GUM no complain feels great wants to go home  Objective:    Vitals:   07/17/17 0536 07/17/17 1458 07/17/17 2117 07/18/17 0540  BP: 138/84 135/82 140/85 (!) 108/55  Pulse: (!) 57 64 61 64  Resp: 18 18 19 20   Temp: (!) 97.4 F (36.3 C) 97.6 F (36.4 C) 97.7 F (36.5 C) 98 F (36.7 C)  TempSrc: Oral Oral  Oral  SpO2: 97% 97% 94% 95%  Weight:      Height:        Intake/Output Summary (Last 24 hours) at 07/18/2017 0932 Last data filed at 07/18/2017 0600 Gross per 24 hour  Intake  5128.75 ml  Output 470 ml  Net 4658.75 ml   Filed Weights   07/16/17 0555  Weight: (!) 150.2 kg (331 lb 1.6 oz)    Exam: General exam: In no acute distress. Respiratory system: Good air movement and clear to auscultation. Cardiovascular system: S1 & S2 heard, RRR.  Gastrointestinal system: Abdomen is nondistended, soft and nontender.  Central nervous system: Alert and oriented. No focal neurological deficits. Extremities: No pedal edema. Skin: No rashes, lesions or ulcers Psychiatry: Judgement and insight appear normal. Mood & affect appropriate.    Data Reviewed:    Labs: Basic Metabolic Panel: Recent Labs  Lab 07/15/17 2143 07/16/17 0552 07/17/17 0534 07/18/17 0547  NA 135 138 138 141  K 3.9 3.6 4.2 4.0  CL 105 105 105 110  CO2 19* 24 24 24   GLUCOSE 185* 169* 124* 95  BUN 37* 41* 47* 41*  CREATININE 1.97* 1.94* 1.87* 1.63*  CALCIUM 7.9* 8.0* 8.2* 8.0*  MG 2.0  --   --   --   PHOS 4.3  --   --    --    GFR Estimated Creatinine Clearance: 65.5 mL/min (A) (by C-G formula based on SCr of 1.63 mg/dL (H)). Liver Function Tests: Recent Labs  Lab 07/15/17 2143  AST 33  ALT 25  ALKPHOS 125  BILITOT 2.2*  PROT 6.5  ALBUMIN 2.1*   No results for input(s): LIPASE, AMYLASE in the last 168 hours. No results for input(s): AMMONIA in the last 168 hours. Coagulation profile Recent Labs  Lab 07/15/17 05-Jun-2141  INR 1.18    CBC: Recent Labs  Lab 07/15/17 06/05/2141 07/16/17 0552 07/17/17 0534 07/18/17 0547  WBC 18.2* 17.1* 18.3* 11.4*  NEUTROABS 16.6*  --   --   --   HGB 12.8* 12.2* 13.0 12.4*  HCT 37.1* 36.4* 39.3 37.0*  MCV 91.2 91.7 91.6 90.9  PLT 341 385 419* 408*   Cardiac Enzymes: No results for input(s): CKTOTAL, CKMB, CKMBINDEX, TROPONINI in the last 168 hours. BNP (last 3 results) No results for input(s): PROBNP in the last 8760 hours. CBG: No results for input(s): GLUCAP in the last 168 hours. D-Dimer: No results for input(s): DDIMER in the last 72 hours. Hgb A1c: Recent Labs    07/16/17 0552 07/17/17 0534  HGBA1C 6.6* 6.5*   Lipid Profile: No results for input(s): CHOL, HDL, LDLCALC, TRIG, CHOLHDL, LDLDIRECT in the last 72 hours. Thyroid function studies: Recent Labs    07/15/17 06-05-41  TSH 0.720   Anemia work up: No results for input(s): VITAMINB12, FOLATE, FERRITIN, TIBC, IRON, RETICCTPCT in the last 72 hours. Sepsis Labs: Recent Labs  Lab 07/15/17 06-05-2141 07/16/17 0552 07/17/17 0534 07/18/17 0547  WBC 18.2* 17.1* 18.3* 11.4*   Microbiology Recent Results (from the past 240 hour(s))  Culture, blood (routine x 2)     Status: None (Preliminary result)   Collection Time: 07/15/17  9:42 PM  Result Value Ref Range Status   Specimen Description BLOOD LEFT HAND  Final   Special Requests   Final    BOTTLES DRAWN AEROBIC AND ANAEROBIC Blood Culture adequate volume Performed at [facility], [address], [place], [place] [address]     Culture   Final    NO GROWTH 2 DAYS Performed at [facility], [address] [place], [place] [address]    Report Status PENDING  Incomplete  Culture, blood (routine x 2)     Status: None (Preliminary result)   Collection  Time: 07/15/17  9:43 PM  Result Value Ref Range Status   Specimen Description BLOOD RIGHT HAND  Final   Special Requests   Final    BOTTLES DRAWN AEROBIC AND ANAEROBIC Blood Culture adequate volume Performed at [facility], [address] [place], [place] [address]    Culture   Final    NO GROWTH 2 DAYS Performed at [facility], [address] [place], [place] [address]    Report Status PENDING  Incomplete  Aerobic/Anaerobic Culture (surgical/deep wound)     Status: None (Preliminary result)   Collection Time: 07/16/17  5:06 PM  Result Value Ref Range Status   Specimen Description   Final    ABSCESS LIVER Performed at [facility], [address] [place], [place] [address]    Special Requests   Final    NONE Performed at [facility], [address] [place], [place] [address]    Gram Stain   Final    ABUNDANT WBC PRESENT, PREDOMINANTLY PMN ABUNDANT GRAM POSITIVE COCCI    Culture   Final    NO GROWTH < 24 HOURS Performed at [facility], [address] [place], [place] [address]    Report Status PENDING  Incomplete     Medications:   . allopurinol  100 mg Oral BID  . amLODipine  10 mg Oral Daily  . buPROPion  100 mg Oral BID  . enoxaparin (LOVENOX) injection  0.5 mg/kg Subcutaneous Q24H  . metoprolol tartrate  50 mg Oral Daily  . pantoprazole  40 mg Oral BID  . sodium chloride flush  5 mL Intracatheter Q8H  . tamsulosin  0.4 mg Oral QPC supper   Continuous Infusions: . cefTRIAXone (ROCEPHIN)  IV 2 g (07/17/17 2203)      LOS: 3 days   Marinda ElkAbraham Feliz Ortiz  [facility] Pager [phone]  *Please refer to [email or web address], password [email or web address] to get updated schedule on who will  round on this patient, as hospitalists switch teams weekly. If 7PM-7AM, please contact night-coverage at [email or web address], password [email or web address] for any overnight needs.  07/18/2017, 9:32 AM

## 2017-07-18 NOTE — Progress Notes (Signed)
Peripherally Inserted Central Catheter/Midline Placement  The IV Nurse has discussed with the patient and/or persons authorized to consent for the patient, the purpose of this procedure and the potential benefits and risks involved with this procedure.  The benefits include less needle sticks, lab draws from the catheter, and the patient may be discharged home with the catheter. Risks include, but not limited to, infection, bleeding, blood clot (thrombus formation), and puncture of an artery; nerve damage and irregular heartbeat and possibility to perform a PICC exchange if needed/ordered by physician.  Alternatives to this procedure were also discussed.  Bard Power PICC patient education guide, fact sheet on infection prevention and patient information card has been provided to patient /or left at bedside.    PICC/Midline Placement Documentation  PICC Single Lumen 07/18/17 PICC Right Basilic 44 cm 1 cm (Active)  Indication for Insertion or Continuance of Line Home intravenous therapies (PICC only) 07/18/2017 11:12 AM  Exposed Catheter (cm) 1 cm 07/18/2017 11:12 AM  Site Assessment Clean;Dry;Intact 07/18/2017 11:12 AM  Line Status Flushed;Blood return noted 07/18/2017 11:12 AM  Dressing Type Transparent 07/18/2017 11:12 AM  Dressing Status Clean;Dry;Intact;Antimicrobial disc in place 07/18/2017 11:12 AM  Dressing Intervention New dressing 07/18/2017 11:12 AM  Dressing Change Due 07/25/17 07/18/2017 11:12 AM       Audrie GallusByerly, Lanell Carpenter Ramos 07/18/2017, 11:16 AM

## 2017-07-18 NOTE — Progress Notes (Signed)
General Surgery [facility]  Assessment & Plan: Intra-abdominal abscess, erosion of gastric band  PICC line to be placed this AM  Anticipating discharge home on home IV abx's after PICC line placed  Follow up to be arranged with Dr. Wenda LowMatt Martin at [facility] office        Velora Hecklerodd M. Kindle Strohmeier, MD, [facility]       [facility]       Office: [phone]    Chief Complaint: Intra-abdominal abscess, erosion of gastric band  Subjective: Patient up in room, no complaints.  Waiting for PICC line placement so he can go home today.  Objective: Vital signs in last 24 hours: Temp:  [97.6 F (36.4 C)-98 F (36.7 C)] 98 F (36.7 C) (07/04 0540) Pulse Rate:  [61-64] 64 (07/04 0540) Resp:  [18-20] 20 (07/04 0540) BP: (108-140)/(55-85) 108/55 (07/04 0540) SpO2:  [94 %-97 %] 95 % (07/04 0540) Last BM Date: 07/13/17  Intake/Output from previous day: 07/03 0701 - 07/04 0700 In: 5368.8 [P.O.:480; I.V.:3288.8; IV Piggyback:1590] Out: 470 [Urine:400; Drains:70] Intake/Output this shift: No intake/output data recorded.  Physical Exam: HEENT - sclerae clear, mucous membranes moist Neck - soft Abdomen - drain with thin, tan fluid in bulb Ext - no edema, non-tender Neuro - alert & oriented, no focal deficits  Lab Results:  Recent Labs    07/17/17 0534 07/18/17 0547  WBC 18.3* 11.4*  HGB 13.0 12.4*  HCT 39.3 37.0*  PLT 419* 408*   BMET Recent Labs    07/17/17 0534 07/18/17 0547  NA 138 141  K 4.2 4.0  CL 105 110  CO2 24 24  GLUCOSE 124* 95  BUN 47* 41*  CREATININE 1.87* 1.63*  CALCIUM 8.2* 8.0*   PT/INR Recent Labs    07/15/17 2143  LABPROT 14.9  INR 1.18   Comprehensive Metabolic Panel:    Component Value Date/Time   NA 141 07/18/2017 0547   NA 138 07/17/2017 0534   K 4.0 07/18/2017 0547   K 4.2 07/17/2017 0534   CL 110 07/18/2017 0547   CL 105 07/17/2017 0534   CO2 24 07/18/2017 0547   CO2 24 07/17/2017 0534   BUN 41 (H)  07/18/2017 0547   BUN 47 (H) 07/17/2017 0534   CREATININE 1.63 (H) 07/18/2017 0547   CREATININE 1.87 (H) 07/17/2017 0534   GLUCOSE 95 07/18/2017 0547   GLUCOSE 124 (H) 07/17/2017 0534   CALCIUM 8.0 (L) 07/18/2017 0547   CALCIUM 8.2 (L) 07/17/2017 0534   AST 33 07/15/2017 2143   AST 20 11/10/2013 1540   ALT 25 07/15/2017 2143   ALT 22 11/10/2013 1540   ALKPHOS 125 07/15/2017 2143   ALKPHOS 101 11/10/2013 1540   BILITOT 2.2 (H) 07/15/2017 2143   BILITOT 0.6 11/10/2013 1540   PROT 6.5 07/15/2017 2143   PROT 7.9 11/10/2013 1540   ALBUMIN 2.1 (L) 07/15/2017 2143   ALBUMIN 3.3 (L) 11/10/2013 1540    Studies/Results: Ct Abdomen Pelvis W Contrast  Addendum Date: 07/16/2017   ADDENDUM REPORT: 07/16/2017 14:21 ADDENDUM: Critical Value/emergent results were called by telephone at the time of interpretation on 07/16/2017 at 2:21 pm to Dr. Carolynne Edouardoth, General Surgery, who verbally acknowledged these results. Electronically Signed   By: Bretta BangWilliam  Woodruff III M.D.   On: 07/16/2017 14:21   Result Date: 07/16/2017 CLINICAL DATA:  Fever with reported liver abscess EXAM: CT ABDOMEN AND PELVIS WITH CONTRAST TECHNIQUE: Multidetector CT imaging of the abdomen and pelvis was performed  using the standard protocol following bolus administration of intravenous contrast. CONTRAST:  75mL OMNIPAQUE IOHEXOL 300 MG/ML  SOLN COMPARISON:  None. Reported recent CT from [facility] not available currently to compare. FINDINGS: Lower chest: There are small pleural effusions bilaterally with bibasilar atelectatic change. Hepatobiliary: Liver measures 22.4 cm in length. There is diffuse hepatic steatosis. There is a fluid collection involving much of the lateral segment right lobe of the liver measuring 8.2 x 8.0 x 5.0 cm consistent with abscess. There is no appreciable air in this area. Note that this abscess abuts a portion of the tubing from a lap band. Pancreas: No pancreatic mass or inflammatory focus. Spleen: No splenic  lesions are evident. Spleen measures 14.0 x 13.8 x 11.3 cm with a measured splenic volume of 1,092 cubic cm. Adrenals/Urinary Tract: Adrenals appear unremarkable bilaterally. There is a cyst arising from the lateral mid left kidney measuring 4.0 x 3.3 cm. There is an 8 x 8 mm cyst in the lower pole of the right kidney laterally. There is no hydronephrosis on either side. There is no evident renal or ureteral calculus on either side. Urinary bladder is midline with wall thickness within normal limits. There is a small urachal remnant without associated mass or inflammatory focus. Stomach/Bowel: There is a lap band positioned at the gastric cardia. There are foci of air outside of bowel lumen immediately adjacent to portions of the tubing of this lap band in the left upper quadrant. There is no appreciable bowel wall or mesenteric thickening. There is air along the wall of the stomach near the insertion for the lap band tubing suggesting erosion from the lap band region through the wall of the stomach. No other free air seen. No portal venous air evident. No evident bowel obstruction. There are scattered colonic diverticula without diverticulitis. Vascular/Lymphatic: There are foci of aortic and left common iliac artery atherosclerosis. No evident aneurysm. No free air or portal venous air. Reproductive: There are foci of prostatic calculi. Prostate and seminal vesicles appear normal in size and contour. No pelvic masses evident. Other: Appendix appears normal. Outside of the liver, there is no evident abscess in the abdomen or pelvis. There is no appreciable ascites in the abdomen or pelvis. There is a ventral hernia containing only fat. There is fat in each in inguinal ring. Musculoskeletal: There is degenerative change in the lower thoracic and lumbar spine regions. There is anterior wedging of several lower thoracic vertebral bodies. There is no evident blastic or lytic bone lesion. There is no intramuscular  lesion evident. IMPRESSION: 1. Abscess in the left lobe of the liver measuring 8.1 x 8.0 x 5.0 cm. Currently no air is appreciable within this abscess. This abscess abuts the tubing for the lap band just to the left of midline in the left upper abdomen. 2. Lap band position in gastric cardia region. There is air tracking along the edge of the lap band into the wall the stomach and along portions of the tubing outside of bowel lumen. There appears to be erosion of the lap band causing leakage of air from the upper stomach. The amount of free air seen is slight. 3. Splenomegaly. Liver is also enlarged. There is hepatic steatosis. 4. No abscess is seen apart from the liver abscess. No ascites evident. Appendix region appears normal. There are scattered colonic diverticula without diverticulitis. No evident bowel obstruction. 5.  No renal or ureteral calculus.  No hydronephrosis. 6.  There is aortoiliac atherosclerosis. 7. There is a  ventral hernia containing only fat. There is fat in each inguinal ring. 8.  Small pleural effusions bilaterally with bibasilar atelectasis. Aortic Atherosclerosis (ICD10-I70.0). Electronically Signed: By: Bretta Bang III M.D. On: 07/16/2017 14:12   US Renal  Result Date: 07/16/2017 CLINICAL DATA:  62 year old male with acute kidney insufficiency. Subsequent encounter. EXAM: RENAL / URINARY TRACT ULTRASOUND COMPLETE COMPARISON:  07/16/2017 CT. FINDINGS: Right Kidney: Length: 12.7 cm. Echogenicity within normal limits. No mass or hydronephrosis visualized. Left Kidney: Length: 12.8 cm. Echogenicity within normal limits. No hydronephrosis. 3.3 x 3.8 x 3.2 cm lower pole cyst. Bladder: Appears normal for degree of bladder distention. IMPRESSION: No hydronephrosis. 3.8 cm left renal cyst. Electronically Signed   By: Lacy Duverney M.D.   On: 07/16/2017 17:28   Dg Chest Port 1 View  Result Date: 07/16/2017 CLINICAL DATA:  Increase shortness of breath, hypoxia EXAM: PORTABLE CHEST 1 VIEW  COMPARISON:  Chest x-ray of 05/22/2011 FINDINGS: The lungs are not as well aerated with probable mild volume loss at the bases left-greater-than-right. No definite pneumonia or pleural effusion is seen. Cardiomegaly is stable. No bony abnormality is noted. IMPRESSION: Diminished aeration with mild basilar volume loss. No definite active process. Stable cardiomegaly. Electronically Signed   By: Dwyane Dee M.D.   On: 07/16/2017 12:26   Ct Image Guided Drainage By Percutaneous Catheter  Result Date: 07/17/2017 INDICATION: Liver abscess EXAM: CT GUIDED DRAINAGE OF HEPATIC ABSCESS MEDICATIONS: The patient is currently admitted to the hospital and receiving intravenous antibiotics. The antibiotics were administered within an appropriate time frame prior to the initiation of the procedure. ANESTHESIA/SEDATION: Two mg IV Versed 100 mcg IV Fentanyl Moderate Sedation Time:  16 minutes The patient was continuously monitored during the procedure by the interventional radiology nurse under my direct supervision. COMPLICATIONS: None immediate. TECHNIQUE: Informed written consent was obtained from the patient after a thorough discussion of the procedural risks, benefits and alternatives. All questions were addressed. Maximal Sterile Barrier Technique was utilized including caps, mask, sterile gowns, sterile gloves, sterile drape, hand hygiene and skin antiseptic. A timeout was performed prior to the initiation of the procedure. PROCEDURE: The upper abdomen was prepped with ChloraPrep in a sterile fashion, and a sterile drape was applied covering the operative field. A sterile gown and sterile gloves were used for the procedure. Local anesthesia was provided with 1% Lidocaine. Under CT guidance, an 18 gauge needle was advanced into the left lobe liver abscess. The needle was removed over an Amplatz wire. Ten [place] dilator followed by a 10 [place] drain were inserted. It was looped and string fixed in the fluid collection. Frank  pus was aspirated. FINDINGS: Images demonstrate placement of a 10 French drain into a hepatic abscess in the left lobe. IMPRESSION: Successful 10 French left lobe hepatic abscess drain placement. Electronically Signed   By: Jolaine Click M.D.   On: 07/17/2017 11:17   [place] Ekg Site Rite  Result Date: 07/17/2017 If Site Rite image not attached, placement could not be confirmed due to current cardiac rhythm.     Silvio Sausedo M 07/18/2017  Patient ID: Trevor Serrano, male   DOB: 1955-12-24, 62 y.o.   MRN: [id number]

## 2017-07-18 NOTE — Discharge Instructions (Signed)
Percutaneous Abscess Drain, Care After °This sheet gives you information about how to care for yourself after your procedure. Your health care provider may also give you more specific instructions. If you have problems or questions, contact your health care provider. °What can I expect after the procedure? °After your procedure, it is common to have: °· A small amount of bruising and discomfort in the area where the drainage tube (catheter) was placed. °· Sleepiness and fatigue. This should go away after the medicines you were given have worn off. ° °Follow these instructions at home: °Incision care °· Follow instructions from your health care provider about how to take care of your incision. Make sure you: °? Wash your hands with soap and water before you change your bandage (dressing). If soap and water are not available, use hand sanitizer. °? Change your dressing as told by your health care provider. °? Leave stitches (sutures), skin glue, or adhesive strips in place. These skin closures may need to stay in place for 2 weeks or longer. If adhesive strip edges start to loosen and curl up, you may trim the loose edges. Do not remove adhesive strips completely unless your health care provider tells you to do that. °· Check your incision area every day for signs of infection. Check for: °? More redness, swelling, or pain. °? More fluid or blood. °? Warmth. °? Pus or a bad smell. °? Fluid leaking from around your catheter (instead of fluid draining through your catheter). °Catheter care °· Follow instructions from your health care provider about emptying and cleaning your catheter and collection bag. You may need to clean the catheter every day so it does not clog. °· If directed, write down the following information every time you empty your bag: °? The date and time. °? The amount of drainage. °General instructions °· Rest at home for 1-2 days after your procedure. Return to your normal activities as told by your  health care provider. °· Do not take baths, swim, or use a hot tub for 24 hours after your procedure, or until your health care provider says that this is okay. °· Take over-the-counter and prescription medicines only as told by your health care provider. °· Keep all follow-up visits as told by your health care provider. This is important. °Contact a health care provider if: °· You have less than 10 mL of drainage a day for 2-3 days in a row, or as directed by your health care provider. °· You have more redness, swelling, or pain around your incision area. °· You have more fluid or blood coming from your incision area. °· Your incision area feels warm to the touch. °· You have pus or a bad smell coming from your incision area. °· You have fluid leaking from around your catheter (instead of through your catheter). °· You have a fever or chills. °· You have pain that does not get better with medicine. °Get help right away if: °· Your catheter comes out. °· You suddenly stop having drainage from your catheter. °· You suddenly have blood in the fluid that is draining from your catheter. °· You become dizzy or you faint. °· You develop a rash. °· You have nausea or vomiting. °· You have difficulty breathing or you feel short of breath. °· You develop chest pain. °· You have problems with your speech or vision. °· You have trouble balancing or moving your arms or legs. °Summary °· It is common to have a small   amount of bruising and discomfort in the area where the drainage tube (catheter) was placed.  You may be directed to record the amount of drainage from the bag every time you empty it.  Follow instructions from your health care provider about emptying and cleaning your catheter and collection bag. This information is not intended to replace advice given to you by your health care provider. Make sure you discuss any questions you have with your health care provider. Document Released: 05/18/2013 Document  Revised: 11/24/2015 Document Reviewed: 11/24/2015 Elsevier Interactive Patient Education  2017 [employer].   [employer], Adult A central line is a soft, flexible tube (catheter) that can be used to collect blood for testing or to give medicine or nutrition through a vein. The tip of the central line ends in a large vein just above the heart called the vena cava. A central line may be placed because:  You need to get medicines or fluids through an IV tube for a long period of time.  You need nutrition but cannot eat or absorb nutrients.  The veins in your hands or arms are hard to access.  You need to have blood taken often for blood tests.  You need a blood transfusion  You need chemotherapy or dialysis.  There are many types of central lines:  Peripherally inserted central catheter (PICC) line. This type is used for intermediate access to long-term access of one week or more. It can be used to draw blood and give fluids or medicines. A PICC looks like an IV tube, but it goes up the arm to the heart. It is usually inserted in the upper arm and taped in place on the arm.  Tunneled central line. This type is used for long-term therapy and dialysis. It is placed in a large vein in the neck, chest, or groin. A tunneled central line is inserted through a small incision made over the vein and is advanced into the heart. It is tunneled beneath the skin and brought out through a second incision.  Non-tunneled central line. This type is used for short-term access, usually of a maximum of 7 days. It is often used in the emergency department. A non-tunneled central line is inserted in the neck, chest, or groin.  Implanted port. This type is used for long-term therapy. It can stay in place longer than other types of central lines. An implanted port is normally inserted in the upper chest but can also be placed in the upper arm or in the abdomen. It is inserted and removed with surgery, and it is  accessed using a special needle.  The type of central line that you receive depends on how long you will need it, your medical condition, and the condition of your veins. What are the risks? Using any type of central line has risks that you should be aware of, including:  Infection.  A blood clot that blocks the central line or forms in the vein and travels to the heart.  Bleeding from the place where the central line was put in.  Developing a hole or crack within the central line. If this happens, the central line will need to be replaced.  Developing an abnormal heart rhythm (arrhythmia). This is rare.  Central line failure.  Follow these instructions at home: Flushing and cleaning the central line  Follow instructions from the health care provider about flushing and cleaning the central line.  Wear a mask when flushing or cleaning the central  line.  Before you flush or clean the central line: ? Wash your hands with soap and water. ? Clean the central line hub with rubbing alcohol. Insertion site care  Keep the insertion site of your central line clean and dry at all times.  Check your incision or central line site every day for signs of infection. Check for: ? More redness, swelling, or pain. ? More fluid or blood. ? Warmth. ? Pus or a bad smell. General instructions  Follow instructions from your health care provider for the type of device that you have.  If the central line accidentally gets pulled on, make sure: ? The bandage (dressing) is okay. ? There is no bleeding. ? The line has not been pulled out.  Return to your normal activities as told by your health care provider. Ask your health care provider what activities are safe for you. You may be restricted from lifting or making repetitive arm movements on the side with the catheter.  Do not swim or bathe unless your health care provider approves.  Keep your dressing dry. Your health care provider can  instruct you about how to keep your specific type of dressing from getting wet.  Keep all follow-up visits as told by your health care provider. This is important. Contact a health care provider if:  You have more redness, swelling, or pain around your incision.  You have more fluid or blood coming from your incision.  Your incision feels warm to the touch.  You have pus or a bad smell coming from your incision. Get help right away if:  You have: ? Chills. ? A fever. ? Shortness of breath. ? Trouble breathing. ? Chest pain. ? Swelling in your neck, face, chest, or arm on the side of your central line.  You are coughing.  You feel your heart beating rapidly or skipping beats.  You feel dizzy or you faint.  Your incision or central line site has red streaks spreading away from the area.  Your incision or central line site is bleeding and does not stop.  Your central line is difficult to flush or will not flush.  You do not get a blood return from the central line.  Your central line gets loose or comes out.  Your central line gets damaged.  Your catheter leaks when flushed or when fluids are infused into it. This information is not intended to replace advice given to you by your health care provider. Make sure you discuss any questions you have with your health care provider. Document Released: 02/22/2005 Document Revised: 08/31/2015 Document Reviewed: 08/10/2015 Elsevier Interactive Patient Education  2017 [employer]  GENERAL SURGERY: POST OP INSTRUCTIONS  ######################################################################  EAT Gradually transition to a high fiber diet with a fiber supplement over the next few weeks after discharge.  Start with a pureed / full liquid diet (see below)  WALK Walk an hour a day.  Control your pain to do that.    CONTROL PAIN Control pain so that you can walk, sleep, tolerate sneezing/coughing, go up/down stairs.  HAVE A  BOWEL MOVEMENT DAILY Keep your bowels regular to avoid problems.  OK to try a laxative to override constipation.  OK to use an antidairrheal to slow down diarrhea.  Call if not better after 2 tries  CALL IF YOU HAVE PROBLEMS/CONCERNS Call if you are still struggling despite following these instructions. Call if you have concerns not answered by these instructions  ######################################################################    1. DIET:  Follow a light bland diet the first 24 hours after arrival home, such as soup, liquids, crackers, etc.  Be sure to include lots of fluids daily.  Avoid fast food or heavy meals as your are more likely to get nauseated.   2. Take your usually prescribed home medications unless otherwise directed. 3. PAIN CONTROL: a. Pain is best controlled by a usual combination of three different methods TOGETHER: i. Ice/Heat ii. Over the counter pain medication iii. Prescription pain medication b. Most patients will experience some swelling and bruising around the incisions.  Ice packs or heating pads (30-60 minutes up to 6 times a day) will help. Use ice for the first few days to help decrease swelling and bruising, then switch to heat to help relax tight/sore spots and speed recovery.  Some people prefer to use ice alone, heat alone, alternating between ice & heat.  Experiment to what works for you.  Swelling and bruising can take several weeks to resolve.   c. It is helpful to take an over-the-counter pain medication regularly for the first few weeks.  Choose one of the following that works best for you: i. Naproxen (Aleve, etc)  Two 220mg  tabs twice a day ii. Ibuprofen (Advil, etc) Three 200mg  tabs four times a day (every meal & bedtime) iii. Acetaminophen (Tylenol, etc) 500-650mg  four times a day (every meal & bedtime) d. A  prescription for pain medication (such as oxycodone, hydrocodone, etc) should be given to you upon discharge.  Take your pain medication as  prescribed.  i. If you are having problems/concerns with the prescription medicine (does not control pain, nausea, vomiting, rash, itching, etc), please call us [phone] to see if we need to switch you to a different pain medicine that will work better for you and/or control your side effect better. ii. If you need a refill on your pain medication, please contact your pharmacy.  They will contact our office to request authorization. Prescriptions will not be filled after 5 pm or on week-ends. 4. Avoid getting constipated.  Between the surgery and the pain medications, it is common to experience some constipation.  Increasing fluid intake and taking a fiber supplement (such as Metamucil, Citrucel, FiberCon, MiraLax, etc) 1-2 times a day regularly will usually help prevent this problem from occurring.  A mild laxative (prune juice, Milk of Magnesia, MiraLax, etc) should be taken according to package directions if there are no bowel movements after 48 hours.   5. Wash / shower every day.  You may shower over the dressings as they are waterproof.  Continue to shower over incision(s) after the dressing is off. 6. Remove your waterproof bandages 5 days after surgery.  You may leave the incision open to air.  You may have skin tapes (Steri Strips) covering the incision(s).  Leave them on until one week, then remove.  You may replace a dressing/Band-Aid to cover the incision for comfort if you wish.      7. ACTIVITIES as tolerated:   a. You may resume regular (light) daily activities beginning the next day--such as daily self-care, walking, climbing stairs--gradually increasing activities as tolerated.  If you can walk 30 minutes without difficulty, it is safe to try more intense activity such as jogging, treadmill, bicycling, low-impact aerobics, swimming, etc. b. Save the most intensive and strenuous activity for last such as sit-ups, heavy lifting, contact sports, etc  Refrain from any heavy lifting or  straining until you are off narcotics for pain control.  c. DO NOT PUSH THROUGH PAIN.  Let pain be your guide: If it hurts to do something, don't do it.  Pain is your body warning you to avoid that activity for another week until the pain goes down. d. You may drive when you are no longer taking prescription pain medication, you can comfortably wear a seatbelt, and you can safely maneuver your car and apply brakes. e. Bonita Quin may have sexual intercourse when it is comfortable.  8. FOLLOW UP in our office a. Please call [facility] at [phone] to set up an appointment to see your surgeon in the office for a follow-up appointment approximately 2-3 weeks after your surgery. b. Make sure that you call for this appointment the day you arrive home to insure a convenient appointment time. 9. IF YOU HAVE DISABILITY OR FAMILY LEAVE FORMS, BRING THEM TO THE OFFICE FOR PROCESSING.  DO NOT GIVE THEM TO YOUR DOCTOR.   WHEN TO CALL us [phone]: 1. Poor pain control 2. Reactions / problems with new medications (rash/itching, nausea, etc)  3. Fever over 101.5 F (38.5 C) 4. Worsening swelling or bruising 5. Continued bleeding from incision. 6. Increased pain, redness, or drainage from the incision 7. Difficulty breathing / swallowing   The clinic staff is available to answer your questions during regular business hours (8:30am-5pm).  Please dont hesitate to call and ask to speak to one of our nurses for clinical concerns.   If you have a medical emergency, go to the nearest emergency room or call 911.  A surgeon from [facility] is always on call at the [facility], [place] [address], [address], [place], [place]  [address] ? MAIN: [phone]) [phone] ? TOLL FREE: [phone] ?  FAX [phone] [email or web address]

## 2017-07-20 LAB — CULTURE, BLOOD (ROUTINE X 2)
Culture: NO GROWTH
Culture: NO GROWTH
SPECIAL REQUESTS: ADEQUATE
SPECIAL REQUESTS: ADEQUATE

## 2017-07-21 LAB — AEROBIC/ANAEROBIC CULTURE (SURGICAL/DEEP WOUND)

## 2017-07-21 LAB — AEROBIC/ANAEROBIC CULTURE W GRAM STAIN (SURGICAL/DEEP WOUND)

## 2017-07-22 ENCOUNTER — Other Ambulatory Visit: Payer: Self-pay | Admitting: *Deleted

## 2017-07-22 ENCOUNTER — Other Ambulatory Visit: Payer: Self-pay | Admitting: Surgery

## 2017-07-22 DIAGNOSIS — K750 Abscess of liver: Secondary | ICD-10-CM

## 2017-08-01 ENCOUNTER — Ambulatory Visit
Admission: RE | Admit: 2017-08-01 | Discharge: 2017-08-01 | Disposition: A | Discharge status: Home / Self Care | Payer: BLUE CROSS/BLUE SHIELD | Source: Ambulatory Visit | Attending: Radiology | Admitting: Radiology

## 2017-08-01 ENCOUNTER — Encounter: Payer: Self-pay | Admitting: *Deleted

## 2017-08-01 ENCOUNTER — Ambulatory Visit
Admission: RE | Admit: 2017-08-01 | Discharge: 2017-08-01 | Disposition: A | Discharge status: Home / Self Care | Payer: BLUE CROSS/BLUE SHIELD | Source: Ambulatory Visit | Attending: Surgery | Admitting: Surgery

## 2017-08-01 DIAGNOSIS — K750 Abscess of liver: Secondary | ICD-10-CM

## 2017-08-01 HISTORY — PX: IR RADIOLOGIST EVAL & MGMT: IMG5224

## 2017-08-01 MED ORDER — IOPAMIDOL (ISOVUE-300) INJECTION 61%
100.0000 mL | Freq: Once | INTRAVENOUS | Status: AC | PRN
  Administered 2017-08-01: 100 mL via INTRAVENOUS

## 2017-08-01 NOTE — Progress Notes (Signed)
Patient ID: Trevor Serrano, male   DOB: 1955-07-16, 62 y.o.   MRN: [id number]        Chief Complaint: Hepatic abscess  Referring Physician(s): Martin,Matthew  History of Present Illness: Trevor Serrano is a 62 y.o. male with past medical history significant for diabetes and obesity, post gastric banding who was found to have a hepatic abscess and CT scan of the abdomen and pelvis performed on 07/16/2017 for the work-up of abdominal pain and fever.  For this the patient underwent CT-guided placement of a hepatic abscess drainage catheter on 07/16/2017.  Patient presents today to the interventional radiology drain clinic for drainage catheter evaluation and management.  Patient is not flushing the percutaneous drainage catheter.  He reports little to no output from the percutaneous catheter for the past 2 to 3 days.  Patient has been compliant with his prescribed course of intravenous antibiotics which he is receiving via an upper extremity PICC line.  Patient is otherwise without complaint.  Specifically, no worsening abdominal pain.  No fever or chills   Past Medical History:  Diagnosis Date  . Diabetes mellitus without complication ([place])   . Gout     Past Surgical History:  Procedure Laterality Date  . GASTRIC BYPASS    . IR RADIOLOGIST EVAL & MGMT  08/01/2017    Allergies: Patient has no known allergies.  Medications: Prior to Admission medications   Medication Sig Start Date End Date Taking? Authorizing Provider  allopurinol (ZYLOPRIM) 100 MG tablet Take 100 mg by mouth 2 (two) times daily.     [provider]  amLODipine (NORVASC) 10 MG tablet Take 10 mg by mouth daily.    [provider]  aspirin EC 81 MG tablet Take 81 mg by mouth daily.    [provider]  buPROPion (WELLBUTRIN) 100 MG tablet Take 100 mg by mouth 2 (two) times daily.     [provider]  cefTRIAXone (ROCEPHIN) IVPB Inject 2 g into the vein daily for 18 days. Indication:   Liver abscess Last Day of Therapy:  08/04/17 Labs - Once weekly:  CBC/D and BMP, Labs - Every other week:  ESR and CRP 07/18/17 08/05/17  Armandina Gemma, MD  cetirizine (ZYRTEC) 10 MG tablet Take 10 mg by mouth daily.    [provider]  esomeprazole (NEXIUM) 40 MG capsule Take 40 mg by mouth daily at 12 noon.    [provider]  ferrous sulfate 325 (65 FE) MG tablet Take 325 mg by mouth daily with breakfast.    [provider]  hydrochlorothiazide (MICROZIDE) 12.5 MG capsule Take 12.5 mg by mouth daily.    [provider]  indomethacin (INDOCIN) 25 MG capsule Take 1 capsule (25 mg total) by mouth 3 (three) times daily as needed. Patient not taking: Reported on 07/15/2017 11/10/13   Pamella Pert, MD  indomethacin (INDOCIN) 50 MG capsule Take 50 mg by mouth 2 (two) times daily as needed for mild pain or moderate pain.     [provider]  metoprolol (LOPRESSOR) 50 MG tablet Take 50 mg by mouth daily.     [provider]  Multiple Vitamin (MULTIVITAMIN WITH MINERALS) TABS tablet Take 1 tablet by mouth daily.    [provider]  Omega-3 Fatty Acids (FISH OIL PO) Take 1,000 mg by mouth daily.    [provider]  rosuvastatin (CRESTOR) 5 MG tablet Take 5 mg by mouth daily.    [provider]  telmisartan (MICARDIS)  40 MG tablet Take 40 mg by mouth daily.    [provider]     No family history on file.  Social History   Socioeconomic History  . Marital status: Divorced    Spouse name: Not on file  . Number of children: Not on file  . Years of education: Not on file  . Highest education level: Not on file  Occupational History  . Not on file  Social Needs  . Financial resource strain: Not on file  . Food insecurity:    Worry: Not on file    Inability: Not on file  . Transportation needs:    Medical: Not on file    Non-medical: Not on file  Tobacco Use  . Smoking status: Current Every Day Smoker     Packs/day: 1.00    Types: Cigarettes  Substance and Sexual Activity  . Alcohol use: No  . Drug use: No  . Sexual activity: Never  Lifestyle  . Physical activity:    Days per week: Not on file    Minutes per session: Not on file  . Stress: Not on file  Relationships  . Social connections:    Talks on phone: Not on file    Gets together: Not on file    Attends religious service: Not on file    Active member of club or organization: Not on file    Attends meetings of clubs or organizations: Not on file    Relationship status: Not on file  Other Topics Concern  . Not on file  Social History Narrative  . Not on file    ECOG Status: 1 - Symptomatic but completely ambulatory  Review of Systems: A 12 point ROS discussed and pertinent positives are indicated in the HPI above.  All other systems are negative.  Review of Systems  Constitutional: Negative for activity change and fever.  Gastrointestinal: Negative for abdominal pain.    Vital Signs: There were no vitals taken for this visit.  Physical Exam  Abdominal:    Location of the hepatic abscess drainage catheter  Nursing note and vitals reviewed.   Mallampati Score:     Imaging: Ct Abdomen Pelvis W Contrast  Result Date: 08/01/2017 CLINICAL DATA:  History of hepatic abscess post percutaneous drainage catheter placement on 07/16/2017 by Dr. Barbie Banner. Patient presents today to the interventional radiology drain Clinic for drainage catheter evaluation and management. Patient reports little to no output from the percutaneous drainage catheter for the past several days. Patient continues on his prescribed course of intravenous antibiotics. EXAM: CT ABDOMEN AND PELVIS WITH CONTRAST TECHNIQUE: Multidetector CT imaging of the abdomen and pelvis was performed using the standard protocol following bolus administration of intravenous contrast. CONTRAST:  139m ISOVUE-300 IOPAMIDOL (ISOVUE-300) INJECTION 61% COMPARISON:  CT abdomen  pelvis-07/16/2017; CT-guided percutaneous hepatic abscess drainage catheter placement-07/16/2017 FINDINGS: Lower chest: Limited visualization of the lower thorax demonstrates minimal subsegmental atelectasis within the left costophrenic angle. No focal airspace opacities. No pleural effusion. Normal heart size.  No pericardial effusion. Hepatobiliary: Interval resolution of previously noted ill-defined area of hypoattenuation involving the subcapsular posterior aspect of the lateral segment of the left lobe of the liver following percutaneous drainage catheter placement. No new definable/drainable intrahepatic abscess. No discrete hepatic lesions. Normal appearance of the gallbladder given degree distention. No radiopaque gallstones. No intra extrahepatic biliary duct dilatation. No ascites. Pancreas: Normal appearance of the pancreas Spleen: Normal appearance of the spleen Adrenals/Urinary Tract: There is symmetric enhancement of the bilateral  kidneys. Note is again made of an approximately 3.2 cm hypoattenuating (3 Hounsfield unit) partially exophytic left-sided renal cyst (image 36, series 2). Punctate right-sided renal lesions are too small to accurately characterize of favored to represent additional renal cysts. Punctate (approximately 2 mm) nonobstructing stones within the inferior pole the left kidney (images 104 and 110, series 6). No discrete right-sided renal lesions. There is a minimal amount of grossly symmetric bilateral perinephric stranding. No urinary obstruction. Normal appearance of the bilateral adrenal glands. Normal appearance of the urinary bladder given underdistention. Stomach/Bowel: Stable sequela of prior gastric banding with reservoir imbedded within the soft tissues of the ventral aspect of the abdomen. Moderate colonic stool burden without evidence of enteric obstruction. Normal appearance of the terminal ileum and appendix. No pneumoperitoneum, pneumatosis or portal venous gas.  Vascular/Lymphatic: Scattered atherosclerotic plaque within a tortuous but normal caliber abdominal aorta. The major branch vessels of the abdominal aorta appear patent on this non CTA examination. Scattered porta hepatis lymph nodes are unchanged with index porta hepatis lymph node measuring 1.6 cm in greatest short axis diameter (image 23, series 2, presumably reactive etiology. No definitive bulky retroperitoneal, mesenteric, pelvic or inguinal lymphadenopathy. Reproductive: Dystrophic calcifications within normal sized prostate gland. No free fluid in the pelvic cul-de-sac. Other: Small bilateral mesenteric fat containing inguinal hernias. Note is made of an approximately 4.4 x 4.5 x 3.6 cm mesenteric fat containing periumbilical hernia. Musculoskeletal: No acute or aggressive osseous abnormalities. Mild (approximately 25%) compression deformities involving the anterior aspects of the T9, T10, T11 and T12 vertebral bodies. Moderate multilevel lumbar spine DDD, worse at L4-L5 with disc space height loss, endplate irregularity small posteriorly directed disc osteophyte complex at this location. Bilateral facet degenerative change of the lower lumbar spine. IMPRESSION: 1. Resolved hepatic abscess following percutaneous drainage catheter placement. No new definable/drainable intrahepatic fluid collections. 2. Unchanged prominent porta hepatis lymph nodes, presumed reactive etiology. 3. Stable sequela of prior gastric banding. 4. Punctate (approximately 2 mm) nonobstructing left-sided renal stones. PLAN: Given imaging resolution of the hepatic abscess and lack of output from the percutaneous drainage catheter for the past several days, the decision was made to remove the percutaneous drainage catheter The external portion of the drainage catheter was cut and drainage catheter was removed intact. A dressing was placed. The patient tolerated the procedure well without immediate postprocedural complication. The patient  was encouraged to complete his prescribed course of intravenous antibiotics and to maintain his follow-up appoint with Dr. Hassell Done scheduled for next week. Electronically Signed   By: Sandi Mariscal M.D.   On: 08/01/2017 12:59   Ct Abdomen Pelvis W Contrast  Addendum Date: 07/16/2017   ADDENDUM REPORT: 07/16/2017 14:21 ADDENDUM: Critical Value/emergent results were called by telephone at the time of interpretation on 07/16/2017 at 2:21 pm to Dr. Marlou Starks, General Surgery, who verbally acknowledged these results. Electronically Signed   By: Lowella Grip III M.D.   On: 07/16/2017 14:21   Result Date: 07/16/2017 CLINICAL DATA:  Fever with reported liver abscess EXAM: CT ABDOMEN AND PELVIS WITH CONTRAST TECHNIQUE: Multidetector CT imaging of the abdomen and pelvis was performed using the standard protocol following bolus administration of intravenous contrast. CONTRAST:  89m OMNIPAQUE IOHEXOL 300 MG/ML  SOLN COMPARISON:  None. [facility]. FINDINGS: Lower chest: There are small pleural effusions bilaterally with bibasilar atelectatic change. Hepatobiliary: Liver measures 22.4 cm in length. There is diffuse hepatic steatosis. There is a fluid collection involving much  of the lateral segment right lobe of the liver measuring 8.2 x 8.0 x 5.0 cm consistent with abscess. There is no appreciable air in this area. Note that this abscess abuts a portion of the tubing from a lap band. Pancreas: No pancreatic mass or inflammatory focus. Spleen: No splenic lesions are evident. Spleen measures 14.0 x 13.8 x 11.3 cm with a measured splenic volume of 1,092 cubic cm. Adrenals/Urinary Tract: Adrenals appear unremarkable bilaterally. There is a cyst arising from the lateral mid left kidney measuring 4.0 x 3.3 cm. There is an 8 x 8 mm cyst in the lower pole of the right kidney laterally. There is no hydronephrosis on either side. There is no evident renal or ureteral  calculus on either side. Urinary bladder is midline with wall thickness within normal limits. There is a small urachal remnant without associated mass or inflammatory focus. Stomach/Bowel: There is a lap band positioned at the gastric cardia. There are foci of air outside of bowel lumen immediately adjacent to portions of the tubing of this lap band in the left upper quadrant. There is no appreciable bowel wall or mesenteric thickening. There is air along the wall of the stomach near the insertion for the lap band tubing suggesting erosion from the lap band region through the wall of the stomach. No other free air seen. No portal venous air evident. No evident bowel obstruction. There are scattered colonic diverticula without diverticulitis. Vascular/Lymphatic: There are foci of aortic and left common iliac artery atherosclerosis. No evident aneurysm. No free air or portal venous air. Reproductive: There are foci of prostatic calculi. Prostate and seminal vesicles appear normal in size and contour. No pelvic masses evident. Other: Appendix appears normal. Outside of the liver, there is no evident abscess in the abdomen or pelvis. There is no appreciable ascites in the abdomen or pelvis. There is a ventral hernia containing only fat. There is fat in each in inguinal ring. Musculoskeletal: There is degenerative change in the lower thoracic and lumbar spine regions. There is anterior wedging of several lower thoracic vertebral bodies. There is no evident blastic or lytic bone lesion. There is no intramuscular lesion evident. IMPRESSION: 1. Abscess in the left lobe of the liver measuring 8.1 x 8.0 x 5.0 cm. Currently no air is appreciable within this abscess. This abscess abuts the tubing for the lap band just to the left of midline in the left upper abdomen. 2. Lap band position in gastric cardia region. There is air tracking along the edge of the lap band into the wall the stomach and along portions of the tubing  outside of bowel lumen. There appears to be erosion of the lap band causing leakage of air from the upper stomach. The amount of free air seen is slight. 3. Splenomegaly. Liver is also enlarged. There is hepatic steatosis. 4. No abscess is seen apart from the liver abscess. No ascites evident. Appendix region appears normal. There are scattered colonic diverticula without diverticulitis. No evident bowel obstruction. 5.  No renal or ureteral calculus.  No hydronephrosis. 6.  There is aortoiliac atherosclerosis. 7. There is a ventral hernia containing only fat. There is fat in each inguinal ring. 8.  Small pleural effusions bilaterally with bibasilar atelectasis. Aortic Atherosclerosis (ICD10-I70.0). Electronically Signed: By: Lowella Grip III M.D. On: 07/16/2017 14:12   US Renal  Result Date: 07/16/2017 CLINICAL DATA:  62 year old male with acute kidney insufficiency. Subsequent encounter. EXAM: RENAL / URINARY TRACT ULTRASOUND COMPLETE COMPARISON:  07/16/2017 CT.  FINDINGS: Right Kidney: Length: 12.7 cm. Echogenicity within normal limits. No mass or hydronephrosis visualized. Left Kidney: Length: 12.8 cm. Echogenicity within normal limits. No hydronephrosis. 3.3 x 3.8 x 3.2 cm lower pole cyst. Bladder: Appears normal for degree of bladder distention. IMPRESSION: No hydronephrosis. 3.8 cm left renal cyst. Electronically Signed   By: Genia Del M.D.   On: 07/16/2017 17:28   Dg Chest Port 1 View  Result Date: 07/16/2017 CLINICAL DATA:  Increase shortness of breath, hypoxia EXAM: PORTABLE CHEST 1 VIEW COMPARISON:  Chest x-ray of 05/22/2011 FINDINGS: The lungs are not as well aerated with probable mild volume loss at the bases left-greater-than-right. No definite pneumonia or pleural effusion is seen. Cardiomegaly is stable. No bony abnormality is noted. IMPRESSION: Diminished aeration with mild basilar volume loss. No definite active process. Stable cardiomegaly. Electronically Signed   By: Ivar Drape  M.D.   On: 07/16/2017 12:26   Ct Image Guided Drainage By Percutaneous Catheter  Result Date: 07/17/2017 INDICATION: Liver abscess EXAM: CT GUIDED DRAINAGE OF HEPATIC ABSCESS MEDICATIONS: The patient is currently admitted to the hospital and receiving intravenous antibiotics. The antibiotics were administered within an appropriate time frame prior to the initiation of the procedure. ANESTHESIA/SEDATION: Two mg IV Versed 100 mcg IV Fentanyl Moderate Sedation Time:  16 minutes The patient was continuously monitored during the procedure by the interventional radiology nurse under my direct supervision. COMPLICATIONS: None immediate. TECHNIQUE: Informed written consent was obtained from the patient after a thorough discussion of the procedural risks, benefits and alternatives. All questions were addressed. Maximal Sterile Barrier Technique was utilized including caps, mask, sterile gowns, sterile gloves, sterile drape, hand hygiene and skin antiseptic. A timeout was performed prior to the initiation of the procedure. PROCEDURE: The upper abdomen was prepped with ChloraPrep in a sterile fashion, and a sterile drape was applied covering the operative field. A sterile gown and sterile gloves were used for the procedure. Local anesthesia was provided with 1% Lidocaine. Under CT guidance, an 18 gauge needle was advanced into the left lobe liver abscess. The needle was removed over an Amplatz wire. Ten [place] dilator followed by a 10 [place] drain were inserted. It was looped and string fixed in the fluid collection. Frank pus was aspirated. FINDINGS: Images demonstrate placement of a 10 French drain into a hepatic abscess in the left lobe. IMPRESSION: Successful 10 French left lobe hepatic abscess drain placement. Electronically Signed   By: Marybelle Killings M.D.   On: 07/17/2017 11:17   [place] Ekg Site Rite  Result Date: 07/17/2017 If Site Rite image not attached, placement could not be confirmed due to current cardiac  rhythm.  Ir Radiologist Eval & Mgmt  Result Date: 08/01/2017 Please refer to notes tab for details about interventional procedure. (Op Note)   Labs:  CBC: Recent Labs    07/15/17 2143 07/16/17 0552 07/17/17 0534 07/18/17 0547  WBC 18.2* 17.1* 18.3* 11.4*  HGB 12.8* 12.2* 13.0 12.4*  HCT 37.1* 36.4* 39.3 37.0*  PLT 341 385 419* 408*    COAGS: Recent Labs    07/15/17 2143  INR 1.18  APTT 32    BMP: Recent Labs    07/15/17 2143 07/16/17 0552 07/17/17 0534 07/18/17 0547  NA 135 138 138 141  K 3.9 3.6 4.2 4.0  CL 105 105 105 110  CO2 19* _0 GLUCOSE 185* 169* 124* 95  BUN 37* 41* 47* 41*  CALCIUM 7.9* 8.0* 8.2* 8.0*  CREATININE 1.97* 1.94* 1.87*  1.63*  GFRNONAA 34* 35* 36* 43*  GFRAA 40* 40* 42* 50*    LIVER FUNCTION TESTS: Recent Labs    07/15/17 2143  BILITOT 2.2*  AST 33  ALT 25  ALKPHOS 125  PROT 6.5  ALBUMIN 2.1*    TUMOR MARKERS: No results for input(s): AFPTM, CEA, CA199, CHROMGRNA in the last 8760 hours.  Assessment and Plan:  Trevor Serrano is a 62 y.o. male with past medical history significant for diabetes and obesity, post gastric banding who was found to have a hepatic abscess and CT scan of the abdomen and pelvis performed on 07/16/2017 for the work-up of abdominal pain and fever.  For this the patient underwent CT-guided placement of a hepatic abscess drainage catheter on 07/16/2017.    CT scan of the abdomen pelvis performed at the time of today's appointment demonstrates complete resolution of the hepatic abscess following percutaneous drainage catheter placement.  No new definable/drainable hepatic abscess.  As patient reports little to no output from the percutaneous catheter for the past 2 to 3 days as well as resolution on today's CT scan, the decision was made to remove the percutaneous catheter.  As such, the external portion of the drainage catheter was cut and drainage catheter was removed intact.  A dressing was placed.   The patient tolerated the procedure well without immediate post residual complication.  Patient was encouraged to complete his prescribed course of intravenous antibiotics and to maintain his follow-up appointment scheduled with Dr. Hassell Done for next week.  Patient may otherwise follow-up at the interventional radiology drain clinic on a as needed basis.  Thank you for this interesting consult.  I greatly enjoyed meeting Trevor Serrano and look forward to participating in their care.  A copy of this report was sent to the requesting provider on this date.  Electronically Signed: Sandi Mariscal 08/01/2017, 1:38 PM   I spent a total of 10 Minutes in face to face in clinical consultation, greater than 50% of which was counseling/coordinating care for hepatic abscess drainage catheter evaluation and management.

## 2017-08-02 ENCOUNTER — Other Ambulatory Visit: Payer: Self-pay | Admitting: Pharmacist

## 2017-08-02 NOTE — Progress Notes (Signed)
[facility] pharmacy lab review  

## 2017-08-19 NOTE — Patient Instructions (Addendum)
Trevor Serrano  08/19/2017   Your procedure is scheduled on: 08-22-17  [facility]    Report to Admitting at 5:30 AM    Call this number if you have problems the morning of surgery [phone]   Remember: Do not eat food or drink liquids :After Midnight.     Take these medicines the morning of surgery with A SIP OF WATER: Metoprolol (Lopressor)                                You may not have any metal on your body including hair pins and              piercings  Do not wear jewelry, lotions, powders, cologne or deodorant             Men may shave face and neck.   Do not bring valuables to the hospital. [facility] IS NOT             RESPONSIBLE   FOR VALUABLES.  Contacts, dentures or bridgework may not be worn into surgery.  Leave suitcase in the car. After surgery it may be brought to your room.              Please read over the following fact sheets you were given: _____________________________________________________________________          [facility] - Preparing [facility] Before surgery, you can play an important role.  Because skin is not sterile, your skin needs to be as free of germs as possible.  You can reduce the number of germs on your skin by washing with CHG (chlorahexidine gluconate) soap before surgery.  CHG is an antiseptic cleaner which kills germs and bonds with the skin to continue killing germs even after washing. Please DO NOT use if you have an allergy to CHG or antibacterial soaps.  If your skin becomes reddened/irritated stop using the CHG and inform your nurse when you arrive at Short Stay. Do not shave (including legs and underarms) for at least 48 hours prior to the first CHG shower.  You may shave your face/neck. Please follow these instructions carefully:  1.  Shower with CHG Soap the night before surgery and the  morning of Surgery.  2.  If you choose to wash your hair, wash your hair first as usual  with your  normal  shampoo.  3.  After you shampoo, rinse your hair and body thoroughly to remove the  shampoo.                           4.  Use CHG as you would any other liquid soap.  You can apply chg directly  to the skin and wash                       Gently with a scrungie or clean washcloth.  5.  Apply the CHG Soap to your body ONLY FROM THE NECK DOWN.   Do not use on face/ open                           Wound or open sores. Avoid contact with eyes, ears mouth and genitals (private parts).  Wash face,  Genitals (private parts) with your normal soap.             6.  Wash thoroughly, paying special attention to the area where your surgery  will be performed.  7.  Thoroughly rinse your body with warm water from the neck down.  8.  DO NOT shower/wash with your normal soap after using and rinsing off  the CHG Soap.                9.  Pat yourself dry with a clean towel.            10.  Wear clean pajamas.            11.  Place clean sheets on your bed the night of your first shower and do not  sleep with pets. Day of Surgery : Do not apply any lotions/deodorants the morning of surgery.  Please wear clean clothes to the hospital/surgery center.  FAILURE TO FOLLOW THESE INSTRUCTIONS MAY RESULT IN THE CANCELLATION OF YOUR SURGERY PATIENT SIGNATURE_________________________________  NURSE SIGNATURE__________________________________  ________________________________________________________________________

## 2017-08-19 NOTE — Progress Notes (Addendum)
07-19-17 (Epic) EKG  07-17-17 (Epic) HGA1C  07-16-17 (Epic) ECHO, and CXR

## 2017-08-20 ENCOUNTER — Other Ambulatory Visit: Payer: Self-pay

## 2017-08-20 ENCOUNTER — Encounter (HOSPITAL_COMMUNITY)
Admission: RE | Admit: 2017-08-20 | Discharge: 2017-08-20 | Disposition: A | Discharge status: Home / Self Care | Payer: BLUE CROSS/BLUE SHIELD | Source: Ambulatory Visit | Attending: Surgery | Admitting: Surgery

## 2017-08-20 ENCOUNTER — Encounter (HOSPITAL_COMMUNITY): Payer: Self-pay

## 2017-08-20 DIAGNOSIS — Z01812 Encounter for preprocedural laboratory examination: Secondary | ICD-10-CM | POA: Insufficient documentation

## 2017-08-20 HISTORY — DX: Essential (primary) hypertension: I10

## 2017-08-20 LAB — BASIC METABOLIC PANEL
ANION GAP: 10 (ref 5–15)
BUN: 21 mg/dL (ref 8–23)
CHLORIDE: 105 mmol/L (ref 98–111)
CO2: 24 mmol/L (ref 22–32)
Calcium: 9.2 mg/dL (ref 8.9–10.3)
Creatinine, Ser: 1.56 mg/dL — ABNORMAL HIGH (ref 0.61–1.24)
GFR calc Af Amer: 52 mL/min — ABNORMAL LOW (ref >60)
GFR, EST NON AFRICAN AMERICAN: 45 mL/min — AB (ref >60)
GLUCOSE: 145 mg/dL — AB (ref 70–99)
POTASSIUM: 3.9 mmol/L (ref 3.5–5.1)
SODIUM: 139 mmol/L (ref 135–145)

## 2017-08-20 LAB — CBC
HEMATOCRIT: 43.3 % (ref 39.0–52.0)
HEMOGLOBIN: 14.3 g/dL (ref 13.0–17.0)
MCH: 30.2 pg (ref 26.0–34.0)
MCHC: 33 g/dL (ref 30.0–36.0)
MCV: 91.4 fL (ref 78.0–100.0)
Platelets: 484 10*3/uL — ABNORMAL HIGH (ref 150–400)
RBC: 4.74 MIL/uL (ref 4.22–5.81)
RDW: 14.7 % (ref 11.5–15.5)
WBC: 16.7 10*3/uL — ABNORMAL HIGH (ref 4.0–10.5)

## 2017-08-21 MED ORDER — BUPIVACAINE LIPOSOME 1.3 % IJ SUSP
20.0000 mL | INTRAMUSCULAR | Status: DC
  Filled 2017-08-21: qty 20

## 2017-08-21 NOTE — H&P (Signed)
Chief Complaint:  Liver abscess and erosion of lapband  History of Present Illness:  Trevor Serrano is an 62 y.o. male who had lapband placed several years ago at [facility].  He did not follow up at [facility].  He presented to [facility] earlier this summer with fever and a liver abscess.  Endoscopy at that facility suggested a lapband erosion and he was referred here for treatment.  The abscess was percutaneously drained and he presents at this time for removal of his Lapband.   Past Medical History:  Diagnosis Date  . Diabetes mellitus without complication ([place])   . Gout   . Hypertension     Past Surgical History:  Procedure Laterality Date  . GASTRIC BYPASS    . IR RADIOLOGIST EVAL & MGMT  08/01/2017    No current facility-administered medications for this encounter.    Current Outpatient Medications  Medication Sig Dispense Refill  . allopurinol (ZYLOPRIM) 100 MG tablet Take 200 mg by mouth daily.     Marland Kitchen amoxicillin-clavulanate (AUGMENTIN) 875-125 MG tablet Take 1 tablet by mouth 2 (two) times daily.  0  . aspirin EC 81 MG tablet Take 81 mg by mouth daily.    Marland Kitchen buPROPion (WELLBUTRIN) 100 MG tablet Take 200 mg by mouth every morning.     . cetirizine (ZYRTEC) 10 MG tablet Take 10 mg by mouth daily.    Marland Kitchen esomeprazole (NEXIUM) 40 MG capsule Take 40 mg by mouth daily.     . hydrochlorothiazide (MICROZIDE) 12.5 MG capsule Take 12.5 mg by mouth daily.    . indomethacin (INDOCIN) 50 MG capsule Take 50 mg by mouth 2 (two) times daily as needed for mild pain or moderate pain.     . metoprolol (LOPRESSOR) 50 MG tablet Take 50 mg by mouth daily.     . Multiple Vitamin (MULTIVITAMIN WITH MINERALS) TABS tablet Take 1 tablet by mouth daily.    . Omega-3 Fatty Acids (FISH OIL) 1000 MG CAPS Take 1,000 mg by mouth daily.     Marland Kitchen telmisartan (MICARDIS) 40 MG tablet Take 40 mg by mouth daily.     Patient has no known allergies. No family history on file. Social History:   reports that he has been  smoking cigarettes.  He has a 20.00 pack-year smoking history. He has never used smokeless tobacco. He reports that he does not drink alcohol or use drugs.   REVIEW OF SYSTEMS : Negative except for see his problem list  Physical Exam:   There were no vitals taken for this visit. There is no height or weight on file to calculate BMI.  Gen:  WDWN WM NAD  Neurological: Alert and oriented to person, place, and time. Motor and sensory function is grossly intact  Head: Normocephalic and atraumatic.  Eyes: Conjunctivae are normal. Pupils are equal, round, and reactive to light. No scleral icterus.  Neck: Normal range of motion. Neck supple. No tracheal deviation or thyromegaly present.  Cardiovascular:  SR without murmurs or gallops.  No carotid bruits Breast:  Not examned Respiratory: Effort normal.  No respiratory distress. No chest wall tenderness. Breath sounds normal.  No wheezes, rales or rhonchi.  Abdomen:  nontender at this time.   GU:  Not examined Musculoskeletal: Normal range of motion. Extremities are nontender. No cyanosis, edema or clubbing noted Lymphadenopathy: No cervical, preauricular, postauricular or axillary adenopathy is present Skin: Skin is warm and dry. No rash noted. No diaphoresis. No erythema. No pallor. Pscyh: Normal mood and  affect. Behavior is normal. Judgment and thought content normal.   LABORATORY RESULTS: Results for orders placed or performed during the hospital encounter of 08/20/17 (from the past 48 hour(s))  Basic metabolic panel     Status: Abnormal   Collection Time: 08/20/17  8:26 AM  Result Value Ref Range   Sodium 139 135 - 145 mmol/L   Potassium 3.9 3.5 - 5.1 mmol/L   Chloride 105 98 - 111 mmol/L   CO2 24 22 - 32 mmol/L   Glucose, Bld 145 (H) 70 - 99 mg/dL   BUN 21 8 - 23 mg/dL   Creatinine, Ser 1.56 (H) 0.61 - 1.24 mg/dL   Calcium 9.2 8.9 - 10.3 mg/dL   GFR calc non Af Amer 45 (L) >60 mL/min   GFR calc Af Amer 52 (L) >60 mL/min    Comment:  (NOTE) The eGFR has been calculated using the CKD EPI equation. This calculation has not been validated in all clinical situations. eGFR's persistently <60 mL/min signify possible Chronic Kidney Disease.    Anion gap 10 5 - 15    Comment: Performed at [facility], [place] [address], [place], [place] [address]  CBC     Status: Abnormal   Collection Time: 08/20/17  8:26 AM  Result Value Ref Range   WBC 16.7 (H) 4.0 - 10.5 K/uL   RBC 4.74 4.22 - 5.81 MIL/uL   Hemoglobin 14.3 13.0 - 17.0 g/dL   HCT 43.3 39.0 - 52.0 %   MCV 91.4 78.0 - 100.0 fL   MCH 30.2 26.0 - 34.0 pg   MCHC 33.0 30.0 - 36.0 g/dL   RDW 14.7 11.5 - 15.5 %   Platelets 484 (H) 150 - 400 K/uL    Comment: Performed at [facility], [place] [address], [place], [place] [address]     RADIOLOGY RESULTS: No results found.  Problem List: Patient Active Problem List   Diagnosis Date Noted  . Erosion of gastric band 07/18/2017  . Intraabdominal abscess at Gastric Band from 2011 07/18/2017  .  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 Gout 07/15/2017  . Hypertension 07/15/2017  . Morbid obesity ([place]) BMI 52 07/15/2017  . Allergic rhinitis 07/15/2017  . DDD (degenerative disc disease), lumbar 07/15/2017  . Degenerative arthritis of lumbar spine 07/15/2017  . Depression 07/15/2017  . GERD (gastroesophageal reflux disease) 07/15/2017  . Hyperglycemia 07/15/2017  . Smoker 07/15/2017  . Liver mass, left lobe 07/14/2017  . AKI (acute kidney injury) ([place]) 07/14/2017  . Dysuria 07/14/2017  . Lactic acidosis 07/14/2017  . Sepsis ([place]) 07/14/2017    Assessment & Plan: Erosion of Lapband;  Plan removal by laparoscopy or endoscopy.      Matt B. Hassell Done, MD, [facility] [phone] beeper [phone]  08/21/2017 7:30 AM

## 2017-08-22 ENCOUNTER — Ambulatory Visit (HOSPITAL_COMMUNITY): Payer: BLUE CROSS/BLUE SHIELD | Admitting: Certified Registered"

## 2017-08-22 ENCOUNTER — Encounter (HOSPITAL_COMMUNITY)
Admission: AD | Disposition: A | Discharge status: Home / Self Care | Payer: Self-pay | Source: Home / Self Care | Attending: Surgery

## 2017-08-22 ENCOUNTER — Inpatient Hospital Stay (HOSPITAL_COMMUNITY)
Admission: AD | Admit: 2017-08-22 | Discharge: 2017-08-31 | DRG: 987 | Disposition: A | Discharge status: Home / Self Care | Payer: BLUE CROSS/BLUE SHIELD | Attending: Surgery | Admitting: Surgery

## 2017-08-22 ENCOUNTER — Encounter (HOSPITAL_COMMUNITY): Payer: Self-pay | Admitting: Emergency Medicine

## 2017-08-22 DIAGNOSIS — E43 Unspecified severe protein-calorie malnutrition: Secondary | ICD-10-CM | POA: Diagnosis present

## 2017-08-22 DIAGNOSIS — Z789 Other specified health status: Secondary | ICD-10-CM

## 2017-08-22 DIAGNOSIS — D72829 Elevated white blood cell count, unspecified: Secondary | ICD-10-CM | POA: Diagnosis present

## 2017-08-22 DIAGNOSIS — F329 Major depressive disorder, single episode, unspecified: Secondary | ICD-10-CM | POA: Diagnosis present

## 2017-08-22 DIAGNOSIS — K9509 Other complications of gastric band procedure: Secondary | ICD-10-CM | POA: Diagnosis present

## 2017-08-22 DIAGNOSIS — Y831 Surgical operation with implant of artificial internal device as the cause of abnormal reaction of the patient, or of later complication, without mention of misadventure at the time of the procedure: Secondary | ICD-10-CM | POA: Diagnosis present

## 2017-08-22 DIAGNOSIS — K219 Gastro-esophageal reflux disease without esophagitis: Secondary | ICD-10-CM | POA: Diagnosis present

## 2017-08-22 DIAGNOSIS — M109 Gout, unspecified: Secondary | ICD-10-CM | POA: Diagnosis present

## 2017-08-22 DIAGNOSIS — K429 Umbilical hernia without obstruction or gangrene: Secondary | ICD-10-CM | POA: Diagnosis present

## 2017-08-22 DIAGNOSIS — E119 Type 2 diabetes mellitus without complications: Secondary | ICD-10-CM | POA: Diagnosis present

## 2017-08-22 DIAGNOSIS — M5136 Other intervertebral disc degeneration, lumbar region: Secondary | ICD-10-CM | POA: Diagnosis present

## 2017-08-22 DIAGNOSIS — F17210 Nicotine dependence, cigarettes, uncomplicated: Secondary | ICD-10-CM | POA: Diagnosis present

## 2017-08-22 DIAGNOSIS — Z6841 Body Mass Index (BMI) 40.0 and over, adult: Secondary | ICD-10-CM | POA: Diagnosis not present

## 2017-08-22 DIAGNOSIS — I10 Essential (primary) hypertension: Secondary | ICD-10-CM | POA: Diagnosis present

## 2017-08-22 DIAGNOSIS — Z79899 Other long term (current) drug therapy: Secondary | ICD-10-CM

## 2017-08-22 DIAGNOSIS — M47816 Spondylosis without myelopathy or radiculopathy, lumbar region: Secondary | ICD-10-CM | POA: Diagnosis present

## 2017-08-22 DIAGNOSIS — E440 Moderate protein-calorie malnutrition: Secondary | ICD-10-CM

## 2017-08-22 DIAGNOSIS — Z7982 Long term (current) use of aspirin: Secondary | ICD-10-CM | POA: Diagnosis not present

## 2017-08-22 LAB — CBC
HCT: 41.8 % (ref 39.0–52.0)
Hemoglobin: 13.6 g/dL (ref 13.0–17.0)
MCH: 29.9 pg (ref 26.0–34.0)
MCHC: 32.5 g/dL (ref 30.0–36.0)
MCV: 91.9 fL (ref 78.0–100.0)
PLATELETS: 434 10*3/uL — AB (ref 150–400)
RBC: 4.55 MIL/uL (ref 4.22–5.81)
RDW: 14.8 % (ref 11.5–15.5)
WBC: 17.6 10*3/uL — ABNORMAL HIGH (ref 4.0–10.5)

## 2017-08-22 LAB — CREATININE, SERUM
Creatinine, Ser: 1.46 mg/dL — ABNORMAL HIGH (ref 0.61–1.24)
GFR calc Af Amer: 57 mL/min — ABNORMAL LOW (ref >60)
GFR calc non Af Amer: 49 mL/min — ABNORMAL LOW (ref >60)

## 2017-08-22 LAB — GLUCOSE, CAPILLARY: Glucose-Capillary: 163 mg/dL — ABNORMAL HIGH (ref 70–99)

## 2017-08-22 LAB — MRSA PCR SCREENING: MRSA BY PCR: NEGATIVE

## 2017-08-22 SURGERY — REMOVAL, GASTRIC BAND, LAPAROSCOPIC
Anesthesia: General | Site: Abdomen

## 2017-08-22 MED ORDER — HYDROMORPHONE HCL 1 MG/ML IJ SOLN
1.0000 mg | INTRAMUSCULAR | Status: DC | PRN
  Administered 2017-08-22 – 2017-08-31 (×55): 1 mg via INTRAVENOUS
  Filled 2017-08-22 (×55): qty 1

## 2017-08-22 MED ORDER — HEPARIN SODIUM (PORCINE) 5000 UNIT/ML IJ SOLN
5000.0000 [IU] | Freq: Three times a day (TID) | INTRAMUSCULAR | Status: DC
  Administered 2017-08-22 – 2017-08-31 (×27): 5000 [IU] via SUBCUTANEOUS
  Filled 2017-08-22 (×28): qty 1

## 2017-08-22 MED ORDER — HEPARIN SODIUM (PORCINE) 5000 UNIT/ML IJ SOLN
5000.0000 [IU] | Freq: Once | INTRAMUSCULAR | Status: AC
  Administered 2017-08-22: 5000 [IU] via SUBCUTANEOUS
  Filled 2017-08-22: qty 1

## 2017-08-22 MED ORDER — SUGAMMADEX SODIUM 500 MG/5ML IV SOLN
INTRAVENOUS | Status: DC | PRN
  Administered 2017-08-22: 350 mg via INTRAVENOUS

## 2017-08-22 MED ORDER — ACETAMINOPHEN 500 MG PO TABS
1000.0000 mg | ORAL_TABLET | ORAL | Status: AC
  Administered 2017-08-22: 1000 mg via ORAL
  Filled 2017-08-22: qty 2

## 2017-08-22 MED ORDER — FENTANYL CITRATE (PF) 250 MCG/5ML IJ SOLN
INTRAMUSCULAR | Status: DC | PRN
  Administered 2017-08-22: 100 ug via INTRAVENOUS
  Administered 2017-08-22: 25 ug via INTRAVENOUS
  Administered 2017-08-22: 100 ug via INTRAVENOUS
  Administered 2017-08-22 (×3): 25 ug via INTRAVENOUS
  Administered 2017-08-22: 150 ug via INTRAVENOUS
  Administered 2017-08-22: 50 ug via INTRAVENOUS

## 2017-08-22 MED ORDER — SODIUM CHLORIDE 0.9 % IJ SOLN
INTRAMUSCULAR | Status: DC | PRN
  Administered 2017-08-22: 10 mL via INTRAVENOUS

## 2017-08-22 MED ORDER — ROCURONIUM BROMIDE 10 MG/ML (PF) SYRINGE
PREFILLED_SYRINGE | INTRAVENOUS | Status: AC
  Filled 2017-08-22: qty 10

## 2017-08-22 MED ORDER — FAMOTIDINE IN NACL 20-0.9 MG/50ML-% IV SOLN
20.0000 mg | Freq: Two times a day (BID) | INTRAVENOUS | Status: AC
  Administered 2017-08-22 – 2017-08-23 (×3): 20 mg via INTRAVENOUS
  Filled 2017-08-22 (×3): qty 50

## 2017-08-22 MED ORDER — 0.9 % SODIUM CHLORIDE (POUR BTL) OPTIME
TOPICAL | Status: DC | PRN
  Administered 2017-08-22: 1000 mL

## 2017-08-22 MED ORDER — PHENYLEPHRINE 40 MCG/ML (10ML) SYRINGE FOR IV PUSH (FOR BLOOD PRESSURE SUPPORT)
PREFILLED_SYRINGE | INTRAVENOUS | Status: AC
  Filled 2017-08-22: qty 10

## 2017-08-22 MED ORDER — KETAMINE HCL 10 MG/ML IJ SOLN
INTRAMUSCULAR | Status: DC | PRN
  Administered 2017-08-22 (×3): 10 mg via INTRAVENOUS

## 2017-08-22 MED ORDER — LIDOCAINE 2% (20 MG/ML) 5 ML SYRINGE
INTRAMUSCULAR | Status: DC | PRN
  Administered 2017-08-22: 80 mg via INTRAVENOUS

## 2017-08-22 MED ORDER — KETAMINE HCL 10 MG/ML IJ SOLN
INTRAMUSCULAR | Status: AC
  Filled 2017-08-22: qty 1

## 2017-08-22 MED ORDER — EPHEDRINE 5 MG/ML INJ
INTRAVENOUS | Status: AC
  Filled 2017-08-22: qty 10

## 2017-08-22 MED ORDER — CHLORHEXIDINE GLUCONATE CLOTH 2 % EX PADS: 6.0000 | MEDICATED_PAD | Freq: Once | CUTANEOUS | Status: DC

## 2017-08-22 MED ORDER — SODIUM CHLORIDE 0.9 % IJ SOLN
INTRAMUSCULAR | Status: AC
  Filled 2017-08-22: qty 10

## 2017-08-22 MED ORDER — HYDRALAZINE HCL 20 MG/ML IJ SOLN: 10.0000 mg | INTRAMUSCULAR | Status: DC | PRN

## 2017-08-22 MED ORDER — HYDROMORPHONE HCL 1 MG/ML IJ SOLN
0.5000 mg | INTRAMUSCULAR | Status: DC | PRN
  Administered 2017-08-22: 0.5 mg via INTRAVENOUS
  Filled 2017-08-22: qty 1

## 2017-08-22 MED ORDER — ONDANSETRON HCL 4 MG/2ML IJ SOLN
INTRAMUSCULAR | Status: AC
  Filled 2017-08-22: qty 2

## 2017-08-22 MED ORDER — MEPERIDINE HCL 50 MG/ML IJ SOLN: 6.2500 mg | INTRAMUSCULAR | Status: DC | PRN

## 2017-08-22 MED ORDER — GABAPENTIN 300 MG PO CAPS
300.0000 mg | ORAL_CAPSULE | ORAL | Status: AC
  Administered 2017-08-22: 300 mg via ORAL
  Filled 2017-08-22: qty 1

## 2017-08-22 MED ORDER — MIDAZOLAM HCL 2 MG/2ML IJ SOLN
INTRAMUSCULAR | Status: DC | PRN
  Administered 2017-08-22: 2 mg via INTRAVENOUS

## 2017-08-22 MED ORDER — HEPARIN SODIUM (PORCINE) 5000 UNIT/ML IJ SOLN: 5000.0000 [IU] | Freq: Three times a day (TID) | INTRAMUSCULAR | Status: DC

## 2017-08-22 MED ORDER — SODIUM CHLORIDE 0.9 % IV SOLN
2.0000 g | Freq: Two times a day (BID) | INTRAVENOUS | Status: DC
  Administered 2017-08-22 – 2017-08-31 (×18): 2 g via INTRAVENOUS
  Filled 2017-08-22 (×19): qty 2

## 2017-08-22 MED ORDER — PROPOFOL 10 MG/ML IV BOLUS
INTRAVENOUS | Status: DC | PRN
  Administered 2017-08-22: 100 mg via INTRAVENOUS

## 2017-08-22 MED ORDER — ONDANSETRON 4 MG PO TBDP: 4.0000 mg | ORAL_TABLET | Freq: Four times a day (QID) | ORAL | Status: DC | PRN

## 2017-08-22 MED ORDER — CEFOTETAN DISODIUM 2 G IJ SOLR
2.0000 g | INTRAMUSCULAR | Status: AC
  Administered 2017-08-22: 2 g via INTRAVENOUS
  Filled 2017-08-22: qty 2

## 2017-08-22 MED ORDER — METOPROLOL TARTRATE 5 MG/5ML IV SOLN: 5.0000 mg | Freq: Four times a day (QID) | INTRAVENOUS | Status: DC | PRN

## 2017-08-22 MED ORDER — ROCURONIUM BROMIDE 10 MG/ML (PF) SYRINGE
PREFILLED_SYRINGE | INTRAVENOUS | Status: DC | PRN
  Administered 2017-08-22: 50 mg via INTRAVENOUS
  Administered 2017-08-22 (×4): 10 mg via INTRAVENOUS

## 2017-08-22 MED ORDER — MIDAZOLAM HCL 2 MG/2ML IJ SOLN
INTRAMUSCULAR | Status: AC
  Filled 2017-08-22: qty 2

## 2017-08-22 MED ORDER — ONDANSETRON HCL 4 MG/2ML IJ SOLN: 4.0000 mg | Freq: Once | INTRAMUSCULAR | Status: DC | PRN

## 2017-08-22 MED ORDER — LIDOCAINE 2% (20 MG/ML) 5 ML SYRINGE
INTRAMUSCULAR | Status: AC
  Filled 2017-08-22: qty 5

## 2017-08-22 MED ORDER — KCL IN DEXTROSE-NACL 20-5-0.45 MEQ/L-%-% IV SOLN
INTRAVENOUS | Status: DC
  Administered 2017-08-22 (×2): via INTRAVENOUS
  Filled 2017-08-22 (×2): qty 1000

## 2017-08-22 MED ORDER — LACTATED RINGERS IV SOLN
INTRAVENOUS | Status: DC | PRN
  Administered 2017-08-22 (×3): via INTRAVENOUS

## 2017-08-22 MED ORDER — PROPOFOL 10 MG/ML IV BOLUS
INTRAVENOUS | Status: AC
  Filled 2017-08-22: qty 20

## 2017-08-22 MED ORDER — FENTANYL CITRATE (PF) 250 MCG/5ML IJ SOLN
INTRAMUSCULAR | Status: AC
  Filled 2017-08-22: qty 5

## 2017-08-22 MED ORDER — BUPIVACAINE LIPOSOME 1.3 % IJ SUSP
INTRAMUSCULAR | Status: DC | PRN
  Administered 2017-08-22: 20 mL

## 2017-08-22 MED ORDER — ONDANSETRON HCL 4 MG/2ML IJ SOLN: 4.0000 mg | Freq: Four times a day (QID) | INTRAMUSCULAR | Status: DC | PRN

## 2017-08-22 MED ORDER — HYDROMORPHONE HCL 1 MG/ML IJ SOLN: 0.2500 mg | INTRAMUSCULAR | Status: DC | PRN

## 2017-08-22 MED ORDER — CELECOXIB 200 MG PO CAPS
200.0000 mg | ORAL_CAPSULE | ORAL | Status: AC
  Administered 2017-08-22: 200 mg via ORAL
  Filled 2017-08-22: qty 1

## 2017-08-22 MED ORDER — ONDANSETRON HCL 4 MG/2ML IJ SOLN
INTRAMUSCULAR | Status: DC | PRN
  Administered 2017-08-22 (×2): 4 mg via INTRAVENOUS

## 2017-08-22 MED ORDER — LACTATED RINGERS IR SOLN
Status: DC | PRN
  Administered 2017-08-22: 1000 mL

## 2017-08-22 MED ORDER — LIDOCAINE 2% (20 MG/ML) 5 ML SYRINGE
INTRAMUSCULAR | Status: DC | PRN
  Administered 2017-08-22: 1.5 mg/kg/h via INTRAVENOUS

## 2017-08-22 SURGICAL SUPPLY — 48 items
APL SKNCLS STERI-STRIP NONHPOA (GAUZE/BANDAGES/DRESSINGS)
BENZOIN TINCTURE PRP APPL 2/3 (GAUZE/BANDAGES/DRESSINGS) IMPLANT
BLADE HEX COATED 2.75 (ELECTRODE) ×2 IMPLANT
BLADE SURG 15 STRL LF DISP TIS (BLADE) ×1 IMPLANT
BLADE SURG 15 STRL SS (BLADE) ×2
DECANTER SPIKE VIAL GLASS SM (MISCELLANEOUS) ×4 IMPLANT
DEVICE SUTURE ENDOST 10MM (ENDOMECHANICALS) ×2 IMPLANT
DISSECTOR BLUNT TIP ENDO 5MM (MISCELLANEOUS) IMPLANT
DRAIN CHANNEL 19F RND (DRAIN) ×4 IMPLANT
ELECT PENCIL ROCKER SW 15FT (MISCELLANEOUS) ×2 IMPLANT
ELECT REM PT RETURN 15FT ADLT (MISCELLANEOUS) ×2 IMPLANT
ENDOSTITCH 0 SINGLE 48 (SUTURE) ×2 IMPLANT
EVACUATOR SILICONE 100CC (DRAIN) ×4 IMPLANT
GAUZE SPONGE 4X4 12PLY STRL (GAUZE/BANDAGES/DRESSINGS) ×2 IMPLANT
GLOVE BIOGEL M 8.0 STRL (GLOVE) ×2 IMPLANT
GLOVE BIOGEL PI IND STRL 7.0 (GLOVE) ×1 IMPLANT
GLOVE BIOGEL PI INDICATOR 7.0 (GLOVE) ×1
GOWN SPEC L4 XLG W/TWL (GOWN DISPOSABLE) ×2 IMPLANT
GOWN STRL REUS W/TWL XL LVL3 (GOWN DISPOSABLE) ×6 IMPLANT
GRASPER SUT TROCAR 14GX15 (MISCELLANEOUS) ×2 IMPLANT
HOVERMATT SINGLE USE (MISCELLANEOUS) ×2 IMPLANT
KIT BASIN OR (CUSTOM PROCEDURE TRAY) ×2 IMPLANT
NEEDLE SPNL 22GX3.5 QUINCKE BK (NEEDLE) ×2 IMPLANT
NS IRRIG 1000ML POUR BTL (IV SOLUTION) ×2 IMPLANT
PACK UNIVERSAL I (CUSTOM PROCEDURE TRAY) ×2 IMPLANT
SCISSORS LAP 5X45 EPIX DISP (ENDOMECHANICALS) ×2 IMPLANT
SET IRRIG TUBING LAPAROSCOPIC (IRRIGATION / IRRIGATOR) IMPLANT
SHEARS HARMONIC ACE PLUS 45CM (MISCELLANEOUS) IMPLANT
SLEEVE ADV FIXATION 5X100MM (TROCAR) ×2 IMPLANT
SOLUTION ANTI FOG 6CC (MISCELLANEOUS) ×2 IMPLANT
SPONGE LAP 18X18 RF (DISPOSABLE) ×2 IMPLANT
STAPLER VISISTAT 35W (STAPLE) ×2 IMPLANT
STRIP CLOSURE SKIN 1/2X4 (GAUZE/BANDAGES/DRESSINGS) IMPLANT
SUT ETHILON 2 0 PS N (SUTURE) ×6 IMPLANT
SUT MNCRL AB 4-0 PS2 18 (SUTURE) ×2 IMPLANT
SUT VIC AB 2-0 SH 27 (SUTURE)
SUT VIC AB 2-0 SH 27X BRD (SUTURE) IMPLANT
SUT VIC AB 4-0 SH 18 (SUTURE) ×4 IMPLANT
SUT VICRYL 0 TIES 12 18 (SUTURE) ×2 IMPLANT
SYR 20CC LL (SYRINGE) ×2 IMPLANT
TOWEL OR 17X26 10 PK STRL BLUE (TOWEL DISPOSABLE) ×4 IMPLANT
TOWEL OR NON WOVEN STRL DISP B (DISPOSABLE) ×2 IMPLANT
TRAY CATH 16FR W/PLASTIC CATH (SET/KITS/TRAYS/PACK) ×2 IMPLANT
TROCAR ADV FIXATION 11X100MM (TROCAR) ×4 IMPLANT
TROCAR ADV FIXATION 5X100MM (TROCAR) ×2 IMPLANT
TROCAR BLADELESS 15MM (ENDOMECHANICALS) ×2 IMPLANT
TROCAR BLADELESS OPT 5 100 (ENDOMECHANICALS) ×2 IMPLANT
TUBING INSUF HEATED (TUBING) ×2 IMPLANT

## 2017-08-22 NOTE — Transfer of Care (Signed)
Immediate Anesthesia Transfer of Care Note  Patient: Trevor RousJerry F Lyster  Procedure(s) Performed: LAPAROSCOPIC REMOVAL OF GASTRIC BAND, Upper Endoscopy, ERAS Pathway (N/A Abdomen)  Patient Location: PACU  Anesthesia Type:General  Level of Consciousness: sedated  Airway & Oxygen Therapy: Patient Spontanous Breathing and Patient connected to face mask oxygen  Post-op Assessment: Report given to RN and Post -op Vital signs reviewed and stable  Post vital signs: Reviewed and stable  Last Vitals:  Vitals Value Taken Time  BP 115/64 08/22/2017 11:35 AM  Temp    Pulse 89 08/22/2017 11:38 AM  Resp 19 08/22/2017 11:38 AM  SpO2 99 % 08/22/2017 11:38 AM  Vitals shown include unvalidated device data.  Last Pain:  Vitals:   08/22/17 0620  TempSrc: Oral  PainSc:       Patients Stated Pain Goal: 4 (08/22/17 [id number])  Complications: No apparent anesthesia complications

## 2017-08-22 NOTE — Progress Notes (Signed)
PHARMACY - ADULT TOTAL PARENTERAL NUTRITION CONSULT NOTE  Insulin Requirements: hx DM but no DM meds PTA  Current Nutrition: NPO  IVF: [id number] 125 ml/hr Central access: IR ordered to place PICC 8/8 TPN start date: pending PICC placement  ASSESSMENT                                                                                                          HPI: 62 y.o. male who [facility].  He did not follow up at [facility].  [facility] this summer with fever and a liver abscess.  Endoscopy at that facility suggested a lapband erosion and he was referred here for treatment.  The abscess was percutaneously drained and he presents at this time for removal of his Lapband.   Significant events: 8/8 laparoscopic removal of lapband and port  Today:    Glucose - hx DM but no DM meds PTA, preop fasting glucose 163  Electrolytes -preop lytes WNL 8/6  Renal -SCr 1.56 8/6 and 1.46 8/8  LFTs -WNL on 7/1  TGs -  Prealbumin -  NUTRITIONAL GOALS                                                                                             RD recs: pending  PLAN                                                                                                                         At 1800 today: if PICC Line is placed   Start Clinimix E 5/15 at 40 ml/hr.  20% fat emulsion at 20 ml/hr over 12 hours  Plan to advance as tolerated to the goal rate.  TPN to contain standard multivitamins and trace elements.  Reduce IVF to 75 ml/hr once TPN at 40 and lipids at 20 ml/hr started  Add SSI q6h  TPN lab panels on Mondays & Thursdays.  F/u daily.  Herby AbrahamMichelle T. Sonam Wandel, Pharm.D [phone] 08/22/2017 2:54 PM

## 2017-08-22 NOTE — Progress Notes (Signed)
Initial Nutrition Assessment  DOCUMENTATION CODES:   Non-severe (moderate) malnutrition in context of acute illness/injury, Morbid obesity  INTERVENTION:  - TPN per Pharmacy. - Diet advancement as medically feasible.   NUTRITION DIAGNOSIS:   Moderate Malnutrition related to acute illness(eroded lap band with planned removal 8/8) as evidenced by energy intake < 75% for > 7 days, percent weight loss, mild fat depletion, mild muscle depletion.  GOAL:   Patient will meet greater than or equal to 90% of their needs  MONITOR:   Diet advancement, Weight trends, Labs, Skin, I & O's  REASON FOR ASSESSMENT:   Consult New TPN/TNA  ASSESSMENT:   Patient with PMH significant for DM, Gout, and s/p lap band 12/2009. [facility] with fever and upper abdominal pain 6/30. [facility] 8 cm abscess of his liver. Pt had upper endoscopy 7/2 that revealed erosion of his lap band. Patient admitted 8/8 for removal of lap band.   Pt POD #0 lap eroded lap band removal. He remains NPO with NGT in place with ~150 mL output at the time of RD visit. Consult receiving for TPN. No PICC or central line in place. Recommended goal for TPN: Clinimix E 5/15 @ 100 mL/hr with 20% ILE @ 20 mL/hr x12 hours. This regimen will provide 2184 kcal and 120 grams of protein.   Patient was seen by another RD on 7/2; note from that date reviewed. Patient reports that since that time, he has only been able to eat one meal (lunch) and one snack per day. He would mainly eat lunch at [employer] and would often get salisbury steak, mashed potatoes with gravy, and lima beans. He would have 2-3 cups of jello for a snack around 9 PM each night. Patient reports very poor appetite and lack of desire to eat. He denies overt nausea with eating and denies ever experiencing abdominal pain d/t eating.   NFPE outlined below. Patient does not weigh himself at home and is unsure of his UBW. Per chart review, he has lost 10 kg  (6.7% body weight) in the past 1 month.   Medications reviewed; 20 mg IV Pepcid BID. Labs reviewed; CBG: 163 mg/dL, creatinine: 1.611.56 mg/dL, GFR: 45 mL/min. IVF: D5-1/2 NS-20 mEq IV KCl @ 125 mL/hr (510 kcal)     NUTRITION - FOCUSED PHYSICAL EXAM:    Most Recent Value  Orbital Region  No depletion  Upper Arm Region  No depletion  Thoracic and Lumbar Region  No depletion  Buccal Region  No depletion  Temple Region  No depletion  Clavicle Bone Region  Mild depletion  Clavicle and Acromion Bone Region  Mild depletion  Scapular Bone Region  Unable to assess  Dorsal Hand  No depletion  Patellar Region  No depletion  Anterior Thigh Region  Unable to assess  Posterior Calf Region  Mild depletion  Edema (RD Assessment)  Mild [generalized, post-op]  Hair  Reviewed  Eyes  Reviewed  Mouth  Reviewed  Skin  Reviewed  Nails  Reviewed       Diet Order:   Diet Order            Diet NPO time specified  Diet effective now              EDUCATION NEEDS:   No education needs have been identified at this time  Skin:  Skin Assessment: Skin Integrity Issues: Skin Integrity Issues:: Incisions Incisions: abdominal (8/8)  Last BM:  PTA/unknown  Height:   Ht Readings from Last 1 Encounters:  08/22/17 5\' 8"  (1.727 m)    Weight:   Wt Readings from Last 1 Encounters:  08/22/17 (!) 140.3 kg    Ideal Body Weight:  70 kg  BMI:  Body mass index is 47.03 kg/m.  Estimated Nutritional Needs:   Kcal:  6962-9528 (15-17 kcal/kg)  Protein:  115-125 grams  Fluid:  >/= 2.1 L/day     Trenton Gammon, MS, RD, LDN, [facility] Inpatient Clinical Dietitian Pager # [phone] After hours/weekend pager # [phone]

## 2017-08-22 NOTE — Anesthesia Procedure Notes (Signed)
Date/Time: 08/22/2017 11:23 AM Performed by: Minerva EndsMirarchi, Neave Lenger M, CRNA Oxygen Delivery Method: Simple face mask Placement Confirmation: positive ETCO2 and breath sounds checked- equal and bilateral Dental Injury: Teeth and Oropharynx as per pre-operative assessment

## 2017-08-22 NOTE — Anesthesia Postprocedure Evaluation (Signed)
Anesthesia Post Note  Patient: Cheri RousJerry F Clugston  Procedure(s) Performed: LAPAROSCOPIC REMOVAL OF GASTRIC BAND, Upper Endoscopy, ERAS Pathway (N/A Abdomen)     Patient location during evaluation: PACU Anesthesia Type: General Level of consciousness: awake Pain management: pain level controlled Vital Signs Assessment: post-procedure vital signs reviewed and stable Respiratory status: spontaneous breathing Cardiovascular status: stable Anesthetic complications: no    Last Vitals:  Vitals:   08/22/17 1315 08/22/17 1330  BP: 126/86 113/89  Pulse: 80 80  Resp: (!) 21 19  Temp: 36.4 C (!) 36.4 C  SpO2: 96% 97%    Last Pain:  Vitals:   08/22/17 1315  TempSrc:   PainSc: Asleep                 Everett Ricciardelli

## 2017-08-22 NOTE — Progress Notes (Signed)
Visited with patient and family at bedside.  Offered contact information.  No questions at this time.

## 2017-08-22 NOTE — Anesthesia Procedure Notes (Signed)
Procedure Name: Intubation Date/Time: 08/22/2017 7:42 AM Performed by: Minerva EndsMirarchi, Kataryna Mcquilkin M, CRNA Pre-anesthesia Checklist: Patient identified, Emergency Drugs available, Suction available and Patient being monitored Patient Re-evaluated:Patient Re-evaluated prior to induction Oxygen Delivery Method: Circle System Utilized Preoxygenation: Pre-oxygenation with 100% oxygen Induction Type: IV induction Ventilation: Mask ventilation without difficulty Laryngoscope Size: Miller and 2 Grade View: Grade I Tube type: Oral Tube size: 7.5 mm Number of attempts: 1 Airway Equipment and Method: Stylet Placement Confirmation: ETT inserted through vocal cords under direct vision,  positive ETCO2 and breath sounds checked- equal and bilateral Secured at: 22 cm Tube secured with: Tape Dental Injury: Teeth and Oropharynx as per pre-operative assessment  Comments: Smooth IV induction Ossey-- -intubation AM CRNA atraumatic-- no teeth prior-- full dentures upper--removed- left with daughter--- no trauma to mouth lips-- bilat BS Ossey

## 2017-08-22 NOTE — Progress Notes (Addendum)
Pt. Has not voided since in-and-out cath in OR at 1130am. Bladder scan showed 7ml. Will continue to monitor pt. through the night and will in-and-out cath if needed, as ordered. Urinal at bedside.

## 2017-08-22 NOTE — Interval H&P Note (Signed)
History and Physical Interval Note:  08/22/2017 7:28 AM  Trevor Serrano  has presented today for surgery, with the diagnosis of Lap Band Erosion, s/p Bariatric Surgery Status, Hepatic Abscess  The various methods of treatment have been discussed with the patient and family. After consideration of risks, benefits and other options for treatment, the patient has consented to  Procedure(s): LAPAROSCOPIC REMOVAL OF GASTRIC BAND, Upper Endoscopy, ERAS Pathway (N/A) as a surgical intervention .  The patient's history has been reviewed, patient examined, no change in status, stable for surgery.  I have reviewed the patient's chart and labs.  Questions were answered to the patient's satisfaction.     Valarie MerinoMatthew B Yvonna Brun

## 2017-08-22 NOTE — Progress Notes (Signed)
IR called this RN and said that it was unlikely they would be able to place PICC today. IV team paged and the IV team nurse was unsure if [facility]. MD Daphine DeutscherMartin paged, waiting to hear plan of care.

## 2017-08-22 NOTE — Op Note (Signed)
Trevor RousJerry F Hickam  08-11-1955 August 22, 2017    PCP:  Catha GosselinLittle, Kevin, MD   Surgeon: Wenda LowMatt Chauntel Windsor, MD, FACS  Asst:  Jaclynn GuarneriBen Hoxworth, MD, FACS  Anes:  general  Preop Dx: Jacqulynn CadetEroded lapband with prior hepatic abscess Postop Dx: same  Procedure: Endoscopy; laparoscopic removal of lapband and port Location Surgery: [facility] Complications: None except that this was a really hard band that was 1/2 in the stomach (anterior erosion)  EBL:   75 cc  Drains: Two 19 Blake drains in the upper abdomen and in the liver edge near the gastrotomy  Description of Procedure:  The patient was taken to OR 2 .  After anesthesia was administered and the patient was prepped  with [employer] and a timeout was performed.  Access to the abdomen was achieved with a 5 mm Optiview through the left upper quadrant.  The liver was used to the anterior abdominal wall into the stomach and the tubing was not even visible initially we were able to find it going up to the site of the port.  Sequential 5 mm trochars were placed in a 15 was placed obliquely in the right upper quadrant.  We did employ the [facility] retractor midline eventually retract the liver.  We began work to try to visualize the band.  I initially performed endoscopy to demonstrate demanded to see if the actual tubing could be visualized in the stomach.  It was not.  We withdrew 6 cc of fluid from the band so it was completely decompressed.  We began laparoscopically following the tubing with the hook electrocautery cutting away on either side of eventually finding the dilated area of the band which was probably a centimeter so from the buckle.  As we are quite deep at that point I went ahead and re-endoscope the patient visualized on the band and retroflexed position and found that the erosion was mainly anterior and posteriorly the band was intact.  With that knowledge we continued laparoscopically cutting down the tubing with the hook electrocautery.  I was able to finally  explant enough of the band in the buccal to cut the buccal and then undo it and I was at that point able to pull the entire device out.  He was it was retrieved through the 11 or for through the 15 mm trocar on the right side.  Transverse incision was made over the port and this was explanted as well along with the mesh packing.  There is some bleeding from a vessel in the muscle which was oversewn with figure-of-eight 4-0 Vicryl and this control this.  At this left a large gastrotomy there was rather high.  Try to free some omentum to see if it would recheck there and I took this down with a low harmonic scalpel but could not reach there under some tension.  I did find some fatty omentum that I was able to with the Endo Stitch suture intact over the whole.  An NG tube was placed and then I placed 2 JP drains one line just over the omental patch #1 down more at the margin of the liver.  These were secured to the skin with nylon sutures.  Patient is noted to have also an umbilical hernia but this was not disturbed.  The 15 trocar was approximated with a single suture using the PRI with a single 0 Vicryl.  A tap block was performed with 30 cc of Exparel.  Wounds were closed with 4-0 Vicryl Dermabond.  Patient will be taken to stepdown because of the contaminated nature of the case and there was some purulence around the band when he went and.  We control any spillage but still feel that he will need to be monitored for septic reaction postop.  The patient remained hemodynamically stable throughout the case.  And out catheter performed and the case and to be taken to the PACU and then to stepdown.  The patient tolerated the procedure well and was taken to the PACU in stable condition.     Matt B. Daphine Deutscher, MD, [facility], [place] [phone]

## 2017-08-22 NOTE — Anesthesia Preprocedure Evaluation (Addendum)
Anesthesia Evaluation  Patient identified by MRN, date of birth, ID band Patient awake    Reviewed: Allergy & Precautions, NPO status , Patient's Chart, lab work & pertinent test results  Airway Mallampati: II  TM Distance: >3 FB Neck ROM: Full    Dental  (+) Dental Advisory Given, Edentulous Upper, Edentulous Lower, Upper Dentures   Pulmonary Current Smoker,    Pulmonary exam normal        Cardiovascular hypertension, Pt. on medications Normal cardiovascular exam     Neuro/Psych Depression    GI/Hepatic GERD  Medicated and Controlled,  Endo/Other  diabetes, Type 2, Oral Hypoglycemic Agents  Renal/GU      Musculoskeletal   Abdominal   Peds  Hematology   Anesthesia Other Findings   Reproductive/Obstetrics                            Anesthesia Physical Anesthesia Plan  ASA: III  Anesthesia Plan: General   Post-op Pain Management:    Induction: Intravenous  PONV Risk Score and Plan: 1 and Ondansetron and Treatment may vary due to age or medical condition  Airway Management Planned: Oral ETT  Additional Equipment:   Intra-op Plan:   Post-operative Plan: Extubation in OR  Informed Consent: I have reviewed the patients History and Physical, chart, labs and discussed the procedure including the risks, benefits and alternatives for the proposed anesthesia with the patient or authorized representative who has indicated his/her understanding and acceptance.     Plan Discussed with: CRNA and Surgeon  Anesthesia Plan Comments:         Anesthesia Quick Evaluation

## 2017-08-23 ENCOUNTER — Inpatient Hospital Stay: Payer: Self-pay

## 2017-08-23 ENCOUNTER — Other Ambulatory Visit: Payer: Self-pay

## 2017-08-23 ENCOUNTER — Inpatient Hospital Stay (HOSPITAL_COMMUNITY): Payer: BLUE CROSS/BLUE SHIELD

## 2017-08-23 DIAGNOSIS — E440 Moderate protein-calorie malnutrition: Secondary | ICD-10-CM

## 2017-08-23 LAB — COMPREHENSIVE METABOLIC PANEL
ALK PHOS: 95 U/L (ref 38–126)
ALT: 29 U/L (ref 0–44)
AST: 39 U/L (ref 15–41)
Albumin: 2.3 g/dL — ABNORMAL LOW (ref 3.5–5.0)
Anion gap: 9 (ref 5–15)
BUN: 17 mg/dL (ref 8–23)
CALCIUM: 8.2 mg/dL — AB (ref 8.9–10.3)
CO2: 22 mmol/L (ref 22–32)
CREATININE: 1.53 mg/dL — AB (ref 0.61–1.24)
Chloride: 107 mmol/L (ref 98–111)
GFR, EST AFRICAN AMERICAN: 54 mL/min — AB (ref >60)
GFR, EST NON AFRICAN AMERICAN: 46 mL/min — AB (ref >60)
Glucose, Bld: 176 mg/dL — ABNORMAL HIGH (ref 70–99)
Potassium: 4.8 mmol/L (ref 3.5–5.1)
Sodium: 138 mmol/L (ref 135–145)
Total Bilirubin: 0.9 mg/dL (ref 0.3–1.2)
Total Protein: 6.7 g/dL (ref 6.5–8.1)

## 2017-08-23 LAB — GLUCOSE, CAPILLARY
GLUCOSE-CAPILLARY: 106 mg/dL — AB (ref 70–99)
Glucose-Capillary: 117 mg/dL — ABNORMAL HIGH (ref 70–99)
Glucose-Capillary: 125 mg/dL — ABNORMAL HIGH (ref 70–99)
Glucose-Capillary: 142 mg/dL — ABNORMAL HIGH (ref 70–99)
Glucose-Capillary: 126 mg/dL — ABNORMAL HIGH (ref 70–99)

## 2017-08-23 LAB — DIFFERENTIAL
BASOS ABS: 0 10*3/uL (ref 0.0–0.1)
Basophils Relative: 0 %
EOS ABS: 0 10*3/uL (ref 0.0–0.7)
EOS PCT: 0 %
LYMPHS ABS: 0.9 10*3/uL (ref 0.7–4.0)
Lymphocytes Relative: 3 %
MONO ABS: 1.3 10*3/uL — AB (ref 0.1–1.0)
Monocytes Relative: 5 %
Neutro Abs: 23.4 10*3/uL — ABNORMAL HIGH (ref 1.7–7.7)
Neutrophils Relative %: 92 %

## 2017-08-23 LAB — CBC
HCT: 43 % (ref 39.0–52.0)
HEMOGLOBIN: 14.1 g/dL (ref 13.0–17.0)
MCH: 30.6 pg (ref 26.0–34.0)
MCHC: 32.8 g/dL (ref 30.0–36.0)
MCV: 93.3 fL (ref 78.0–100.0)
Platelets: 440 10*3/uL — ABNORMAL HIGH (ref 150–400)
RBC: 4.61 MIL/uL (ref 4.22–5.81)
RDW: 15 % (ref 11.5–15.5)
WBC: 25.7 10*3/uL — ABNORMAL HIGH (ref 4.0–10.5)

## 2017-08-23 LAB — TRIGLYCERIDES: Triglycerides: 72 mg/dL (ref <150)

## 2017-08-23 LAB — PHOSPHORUS: Phosphorus: 4.2 mg/dL (ref 2.5–4.6)

## 2017-08-23 LAB — MAGNESIUM: MAGNESIUM: 1.8 mg/dL (ref 1.7–2.4)

## 2017-08-23 LAB — PREALBUMIN: PREALBUMIN: 9.5 mg/dL — AB (ref 18–38)

## 2017-08-23 MED ORDER — LACTATED RINGERS IV BOLUS
1000.0000 mL | Freq: Once | INTRAVENOUS | Status: AC
  Administered 2017-08-23: 1000 mL via INTRAVENOUS

## 2017-08-23 MED ORDER — LIDOCAINE HCL 1 % IJ SOLN
INTRAMUSCULAR | Status: AC
  Filled 2017-08-23: qty 20

## 2017-08-23 MED ORDER — KCL IN DEXTROSE-NACL 20-5-0.45 MEQ/L-%-% IV SOLN
INTRAVENOUS | Status: DC
  Administered 2017-08-23 (×2): via INTRAVENOUS
  Filled 2017-08-23 (×2): qty 1000

## 2017-08-23 MED ORDER — FAT EMULSION PLANT BASED 20 % IV EMUL
240.0000 mL | INTRAVENOUS | Status: AC
  Administered 2017-08-23: 240 mL via INTRAVENOUS
  Filled 2017-08-23: qty 250

## 2017-08-23 MED ORDER — TRACE MINERALS CR-CU-MN-SE-ZN 10-1000-500-60 MCG/ML IV SOLN
INTRAVENOUS | Status: AC
  Administered 2017-08-23: 18:00:00 via INTRAVENOUS
  Filled 2017-08-23 (×2): qty 960

## 2017-08-23 MED ORDER — INSULIN ASPART 100 UNIT/ML ~~LOC~~ SOLN
0.0000 [IU] | SUBCUTANEOUS | Status: DC
  Administered 2017-08-23 – 2017-08-25 (×11): 1 [IU] via SUBCUTANEOUS
  Administered 2017-08-25: 2 [IU] via SUBCUTANEOUS
  Administered 2017-08-26 (×5): 1 [IU] via SUBCUTANEOUS
  Administered 2017-08-27: 2 [IU] via SUBCUTANEOUS
  Administered 2017-08-27 (×2): 1 [IU] via SUBCUTANEOUS
  Administered 2017-08-27: 2 [IU] via SUBCUTANEOUS
  Administered 2017-08-27: 1 [IU] via SUBCUTANEOUS
  Administered 2017-08-28: 2 [IU] via SUBCUTANEOUS
  Administered 2017-08-28: 1 [IU] via SUBCUTANEOUS

## 2017-08-23 NOTE — Progress Notes (Signed)
This RN was told in shift report that pt has not voided since 1130am 08/22/17. This RN got patient up to bedside commode and pt voided 10cc dark amber urine. This RN bladder scanned pt and volume was 0ml. RN will continue to monitor and perform in-and-out cath if needed.

## 2017-08-23 NOTE — Care Management Note (Signed)
Case Management Note  Patient Details  Name: Cheri RousJerry F Michna MRN: [id number] Date of Birth: 1955-04-17  Subjective/Objective:       Pod 1-s/p removal of eroded lap band with abcesses /has ng tube and jp drains/npo          Action/Plan:  Tolerated well/ iv cefotan iv flds, iv tpn, iv pepcid/will follow for cm needs   Expected Discharge Date:                  Expected Discharge Plan:  Home/Self Care  In-House Referral:     Discharge planning Services  CM Consult  Post Acute Care Choice:    Choice offered to:     DME Arranged:    DME Agency:     HH Arranged:    HH Agency:     Status of Service:  In process, will continue to follow  If discussed at Long Length of Stay Meetings, dates discussed:    Additional Comments:  Golda AcreDavis, Annalyn Blecher Lynn, RN 08/23/2017, 10:08 AM

## 2017-08-23 NOTE — Progress Notes (Signed)
Patient has not voided in six hours, bladder scanner performed shows no urine in bladder at this time.  Dr. Daphine DeutscherMartin paged, awaiting response.  Will continue to follow up.

## 2017-08-23 NOTE — Progress Notes (Signed)
PHARMACY - ADULT TOTAL PARENTERAL NUTRITION CONSULT NOTE  Insulin Requirements: hx DM but no DM meds PTA  Current Nutrition: NPO  IVF: [id number] 125 ml/hr Central access: IR [id number] to place PICC 8/8 TPN start date: pending PICC placement  ASSESSMENT                                                                                                          HPI: 62 y.o. male who had lapband [facility].  He did not follow up at [facility].  He presented to [facility] earlier this summer with fever and a liver abscess.  Endoscopy at that facility suggested a lapband erosion and he was referred here for treatment.  The abscess was percutaneously drained and he presents at this time for removal of his Lapband.   Significant events:  8/8 laparoscopic removal of lapband and port 8/9 low UOP> 1 L LR bolus, refused foley  Today:    Glucose - hx DM but no DM meds PTA,  glucose 163, 176 while NPO in IVF, SSI added 8/9  Electrolytes -WNL  Renal -SCr 1.56 8/6 (preop) and 1.53 today with low UOP in IVF at 125 ml/hr, 1 L LR bolus per [facility]  LFTs -WNL on 7/1, 8/9  TGs - 72 8/9  Prealbumin - in process  NUTRITIONAL GOALS                                                                                             RD recs: 8/8: Kcal:  [id number] (15-17 kcal/kg) Protein:  115-125 grams Recommended goal for TPN: Clinimix E 5/15 @ 100 mL/hr with 20% ILE @ 20 mL/hr x12 hours. This regimen will provide 2184 kcal and 120 grams of protein.  PLAN                                                                                                                         At 1800 today:   Start Clinimix E 5/15 at 40 ml/hr.  20% fat emulsion at 20 ml/hr over 12 hours  Plan to advance as tolerated to goal rate 100 ml/hr  TPN to contain  standard multivitamins and trace elements.  Reduce IVF to 75 ml/hr once TPN at 40 and lipids at 20 ml/hr started  Add SSI q4h  Add pepcid 40 mg to TPN and  stop boluses  TPN lab panels on Mondays & Thursdays.  F/u daily.  Herby Abraham, Pharm.D [phone] 08/23/2017 9:33 AM

## 2017-08-23 NOTE — Progress Notes (Signed)
Patient ID: Trevor Serrano, male   DOB: 04-23-1955, 62 y.o.   MRN: [id number] [facility] Progress Note:   1 Day Post-Op  Subjective: Mental status is clear;   Objective: Vital signs in last 24 hours: Temp:  [97.5 F (36.4 C)-98.9 F (37.2 C)] 98.2 F (36.8 C) (08/09 0400) Pulse Rate:  [80-117] 113 (08/09 0000) Resp:  [8-25] 21 (08/09 0800) BP: (98-170)/(56-105) 109/81 (08/09 0800) SpO2:  [89 %-99 %] 89 % (08/09 0000) Weight:  [140.3 kg] 140.3 kg (08/08 1413)  Intake/Output from previous day: 08/08 0701 - 08/09 0700 In: 3846.4 [I.V.:3696.4; IV Piggyback:150] Out: 700 [Urine:300; Emesis/NG output:150; Drains:175; Blood:75] Intake/Output this shift: No intake/output data recorded.  Physical Exam: Work of breathing is not labored;  JPs in place and NG in place  Lab Results:  Results for orders placed or performed during the hospital encounter of 08/22/17 (from the past 48 hour(s))  Glucose, capillary     Status: Abnormal   Collection Time: 08/22/17 11:39 AM  Result Value Ref Range   Glucose-Capillary 163 (H) 70 - 99 mg/dL   Comment 1 Notify RN    Comment 2 Document in Chart   MRSA PCR Screening     Status: None   Collection Time: 08/22/17  1:53 PM  Result Value Ref Range   MRSA by PCR NEGATIVE NEGATIVE    Comment:        The GeneXpert MRSA Assay (FDA approved for NASAL specimens only), is one component of a comprehensive MRSA colonization surveillance program. It is not intended to diagnose MRSA infection nor to guide or monitor treatment for MRSA infections. Performed at [facility], [place] [address] [place], [place] [address]   CBC     Status: Abnormal   Collection Time: 08/22/17  2:00 PM  Result Value Ref Range   WBC 17.6 (H) 4.0 - 10.5 K/uL   RBC 4.55 4.22 - 5.81 MIL/uL   Hemoglobin 13.6 13.0 - 17.0 g/dL   HCT 41.8 39.0 - 52.0 %   MCV 91.9 78.0 - 100.0 fL   MCH 29.9 26.0 - 34.0 pg   MCHC 32.5 30.0 - 36.0 g/dL   RDW 14.8 11.5 -  15.5 %   Platelets 434 (H) 150 - 400 K/uL    Comment: Performed at [facility], [place] [address] [place], [place] [address]  Creatinine, serum     Status: Abnormal   Collection Time: 08/22/17  2:00 PM  Result Value Ref Range   Creatinine, Ser 1.46 (H) 0.61 - 1.24 mg/dL   GFR calc non Af Amer 49 (L) >60 mL/min   GFR calc Af Amer 57 (L) >60 mL/min    Comment: (NOTE) The eGFR has been calculated using the CKD EPI equation. This calculation has not been validated in all clinical situations. eGFR's persistently <60 mL/min signify possible Chronic Kidney Disease. Performed at [facility], [place] [address] [place], [place] [address]   CBC     Status: Abnormal   Collection Time: 08/23/17  3:37 AM  Result Value Ref Range   WBC 25.7 (H) 4.0 - 10.5 K/uL   RBC 4.61 4.22 - 5.81 MIL/uL   Hemoglobin 14.1 13.0 - 17.0 g/dL   HCT 43.0 39.0 - 52.0 %   MCV 93.3 78.0 - 100.0 fL   MCH 30.6 26.0 - 34.0 pg   MCHC 32.8 30.0 - 36.0 g/dL   RDW 15.0 11.5 - 15.5 %   Platelets 440 (H)  150 - 400 K/uL    Comment: Performed at [facility], [place] [address] [place], [place] [address]  Comprehensive metabolic panel     Status: Abnormal   Collection Time: 08/23/17  3:37 AM  Result Value Ref Range   Sodium 138 135 - 145 mmol/L   Potassium 4.8 3.5 - 5.1 mmol/L   Chloride 107 98 - 111 mmol/L   CO2 22 22 - 32 mmol/L   Glucose, Bld 176 (H) 70 - 99 mg/dL   BUN 17 8 - 23 mg/dL   Creatinine, Ser 1.53 (H) 0.61 - 1.24 mg/dL   Calcium 8.2 (L) 8.9 - 10.3 mg/dL   Total Protein 6.7 6.5 - 8.1 g/dL   Albumin 2.3 (L) 3.5 - 5.0 g/dL   AST 39 15 - 41 U/L   ALT 29 0 - 44 U/L   Alkaline Phosphatase 95 38 - 126 U/L   Total Bilirubin 0.9 0.3 - 1.2 mg/dL   GFR calc non Af Amer 46 (L) >60 mL/min   GFR calc Af Amer 54 (L) >60 mL/min    Comment: (NOTE) The eGFR has been calculated using the CKD EPI equation. This calculation has not been validated in all clinical  situations. eGFR's persistently <60 mL/min signify possible Chronic Kidney Disease.    Anion gap 9 5 - 15    Comment: Performed at [facility], [place] [address] [place], [place] [address]  Prealbumin     Status: Abnormal   Collection Time: 08/23/17  3:37 AM  Result Value Ref Range   Prealbumin 9.5 (L) 18 - 38 mg/dL    Comment: Performed at [place] [address] [place], [place] [address]  Magnesium     Status: None   Collection Time: 08/23/17  3:37 AM  Result Value Ref Range   Magnesium 1.8 1.7 - 2.4 mg/dL    Comment: Performed at [facility], [place] [address], [place], [place] [address]  Phosphorus     Status: None   Collection Time: 08/23/17  3:37 AM  Result Value Ref Range   Phosphorus 4.2 2.5 - 4.6 mg/dL    Comment: Performed at [facility], [place] [address], [place], [place] [address]  Triglycerides     Status: None   Collection Time: 08/23/17  3:37 AM  Result Value Ref Range   Triglycerides 72 <150 mg/dL    Comment: Performed at [facility], [place] [address], [place], [place] [address]  Differential     Status: Abnormal   Collection Time: 08/23/17  3:37 AM  Result Value Ref Range   Neutrophils Relative % 92 %   Neutro Abs 23.4 (H) 1.7 - 7.7 K/uL   Lymphocytes Relative 3 %   Lymphs Abs 0.9 0.7 - 4.0 K/uL   Monocytes Relative 5 %   Monocytes Absolute 1.3 (H) 0.1 - 1.0 K/uL   Eosinophils Relative 0 %   Eosinophils Absolute 0.0 0.0 - 0.7 K/uL   Basophils Relative 0 %   Basophils Absolute 0.0 0.0 - 0.1 K/uL    Comment: Performed at [facility], [place] [address] [place],  [address]    Radiology/Results: No results found.  Anti-infectives: Anti-infectives (From admission, onward)   Start     Dose/Rate Route Frequency Ordered Stop   08/22/17 1800  cefoTEtan (CEFOTAN) 2 g in sodium chloride 0.9 % 100 mL IVPB     2 g 200 mL/hr over 30 Minutes Intravenous  Every  12 hours 08/22/17 1346     08/22/17 0615  cefoTEtan (CEFOTAN) 2 g in sodium chloride 0.9 % 100 mL IVPB     2 g 200 mL/hr over 30 Minutes Intravenous On call to O.R. 08/22/17 0604 08/22/17 0815      Assessment/Plan: Problem List: Patient Active Problem List   Diagnosis Date Noted  . Malnutrition of moderate degree 08/23/2017  . Complication of gastric banding 08/22/2017  . Gastric band erosion 08/22/2017  . Erosion of gastric band 07/18/2017  . Intraabdominal abscess at Gastric Band from 2011 07/18/2017  .  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 Gout 07/15/2017  . Hypertension 07/15/2017  . Morbid obesity ([place]) BMI 52 07/15/2017  . Allergic rhinitis 07/15/2017  . DDD (degenerative disc disease), lumbar 07/15/2017  . Degenerative arthritis of lumbar spine 07/15/2017  . Depression 07/15/2017  . GERD (gastroesophageal reflux disease) 07/15/2017  . Hyperglycemia 07/15/2017  . Smoker 07/15/2017  . Liver mass, left lobe 07/14/2017  . AKI (acute kidney injury) ([place]) 07/14/2017  . Dysuria 07/14/2017  . Lactic acidosis 07/14/2017  . Sepsis ([place]) 07/14/2017    Urine output down--IV fluid ordered 1 liter LR bolus-patient refused Foley.  Will monitor urine output   1 Day Post-Op    LOS: 1 day   Matt B. Hassell Done, MD, [facility] [phone] beeper [phone]  08/23/2017 9:20 AM

## 2017-08-23 NOTE — Procedures (Signed)
  Procedure: R arm PICC 40cm DL to svc/ra jct EBL:   minimal Complications:  none immediate  See full dictation in [facility] PACS.  Thora Lance. Donte Kary MD Main # [phone] Pager  [phone]

## 2017-08-24 LAB — GLUCOSE, CAPILLARY
GLUCOSE-CAPILLARY: 143 mg/dL — AB (ref 70–99)
GLUCOSE-CAPILLARY: 150 mg/dL — AB (ref 70–99)
Glucose-Capillary: 147 mg/dL — ABNORMAL HIGH (ref 70–99)
Glucose-Capillary: 130 mg/dL — ABNORMAL HIGH (ref 70–99)
Glucose-Capillary: 125 mg/dL — ABNORMAL HIGH (ref 70–99)
Glucose-Capillary: 121 mg/dL — ABNORMAL HIGH (ref 70–99)

## 2017-08-24 LAB — CBC WITH DIFFERENTIAL/PLATELET
Basophils Absolute: 0 10*3/uL (ref 0.0–0.1)
Basophils Relative: 0 %
Eosinophils Absolute: 0.3 10*3/uL (ref 0.0–0.7)
Eosinophils Relative: 2 %
HEMATOCRIT: 40.3 % (ref 39.0–52.0)
HEMOGLOBIN: 12.8 g/dL — AB (ref 13.0–17.0)
LYMPHS ABS: 1.5 10*3/uL (ref 0.7–4.0)
Lymphocytes Relative: 7 %
MCH: 29.9 pg (ref 26.0–34.0)
MCHC: 31.8 g/dL (ref 30.0–36.0)
MCV: 94.2 fL (ref 78.0–100.0)
Monocytes Absolute: 1.7 10*3/uL — ABNORMAL HIGH (ref 0.1–1.0)
Monocytes Relative: 8 %
NEUTROS ABS: 17.4 10*3/uL — AB (ref 1.7–7.7)
NEUTROS PCT: 83 %
Platelets: 398 10*3/uL (ref 150–400)
RBC: 4.28 MIL/uL (ref 4.22–5.81)
RDW: 15.4 % (ref 11.5–15.5)
WBC: 21 10*3/uL — AB (ref 4.0–10.5)

## 2017-08-24 LAB — BASIC METABOLIC PANEL
Anion gap: 7 (ref 5–15)
BUN: 15 mg/dL (ref 8–23)
CHLORIDE: 104 mmol/L (ref 98–111)
CO2: 24 mmol/L (ref 22–32)
Calcium: 8 mg/dL — ABNORMAL LOW (ref 8.9–10.3)
Creatinine, Ser: 1.2 mg/dL (ref 0.61–1.24)
GFR calc Af Amer: >60 mL/min (ref >60)
GFR calc non Af Amer: >60 mL/min (ref >60)
GLUCOSE: 152 mg/dL — AB (ref 70–99)
POTASSIUM: 4.3 mmol/L (ref 3.5–5.1)
Sodium: 135 mmol/L (ref 135–145)

## 2017-08-24 LAB — PHOSPHORUS: Phosphorus: 2.8 mg/dL (ref 2.5–4.6)

## 2017-08-24 LAB — MAGNESIUM: Magnesium: 1.9 mg/dL (ref 1.7–2.4)

## 2017-08-24 MED ORDER — KCL IN DEXTROSE-NACL 20-5-0.45 MEQ/L-%-% IV SOLN
INTRAVENOUS | Status: DC
  Administered 2017-08-24 – 2017-08-25 (×2): via INTRAVENOUS
  Filled 2017-08-24 (×2): qty 1000

## 2017-08-24 MED ORDER — CHLORHEXIDINE GLUCONATE CLOTH 2 % EX PADS
6.0000 | MEDICATED_PAD | Freq: Every day | CUTANEOUS | Status: DC
  Administered 2017-08-24 – 2017-08-30 (×7): 6 via TOPICAL

## 2017-08-24 MED ORDER — KCL IN DEXTROSE-NACL 20-5-0.45 MEQ/L-%-% IV SOLN
INTRAVENOUS | Status: AC
  Administered 2017-08-24: 07:00:00 via INTRAVENOUS

## 2017-08-24 MED ORDER — TRACE MINERALS CR-CU-MN-SE-ZN 10-1000-500-60 MCG/ML IV SOLN
INTRAVENOUS | Status: AC
  Administered 2017-08-24: 18:00:00 via INTRAVENOUS
  Filled 2017-08-24: qty 1800

## 2017-08-24 MED ORDER — KCL IN DEXTROSE-NACL 20-5-0.45 MEQ/L-%-% IV SOLN: INTRAVENOUS | Status: DC

## 2017-08-24 MED ORDER — FAT EMULSION PLANT BASED 20 % IV EMUL
240.0000 mL | INTRAVENOUS | Status: AC
  Administered 2017-08-24: 240 mL via INTRAVENOUS
  Filled 2017-08-24: qty 250

## 2017-08-24 MED ORDER — SODIUM CHLORIDE 0.9% FLUSH
10.0000 mL | INTRAVENOUS | Status: DC | PRN
  Administered 2017-08-28: 10 mL
  Administered 2017-08-30: 20 mL
  Filled 2017-08-24 (×2): qty 40

## 2017-08-24 MED ORDER — PHENOL 1.4 % MT LIQD
1.0000 | OROMUCOSAL | Status: DC | PRN
  Administered 2017-08-25 (×2): 1 via OROMUCOSAL
  Filled 2017-08-24: qty 177

## 2017-08-24 NOTE — Progress Notes (Signed)
PHARMACY - ADULT TOTAL PARENTERAL NUTRITION CONSULT NOTE  Insulin Requirements: hx DM but no DM meds PTA, 4 U SSI/24 hrs  Current Nutrition: NPO  IVF: [id number] 75 ml/hr Central access: IR placed PICC 8/9 TPN start date: 8/9  ASSESSMENT                                                                                                          HPI: 62 y.o. male who [facility].  He did not follow up at [facility].  He presented to [facility] earlier this summer with fever and a liver abscess.  Endoscopy at that facility suggested a lapband erosion and he was referred here for treatment.  The abscess was percutaneously drained and he presents at this time for removal of his Lapband.   Significant events:  8/8 laparoscopic removal of lapband and port 8/9 low UOP> 1 L LR bolus, refused foley  Today:   Glucose - hx DM but no DM meds PTA, 4 U SSI/24 hrs, CBGs after TPN started: 142, 126, 152 - acceptable  Electrolytes -WNL  Renal -SCr 1.56 8/6 (preop) 1.53>>1.2, low UOP 8/9 w/ no response to 1L LR bolus, bladder scan w/no urine last night, I/O _ 476, 950 ml UOP/24 hrs  LFTs -WNL on 7/1, 8/9  TGs - 72 8/9  Prealbumin - 9.5 on 8/9 - reflects inflammation and stress of OR  NUTRITIONAL GOALS                                                                                             RD recs: 8/8: Kcal:  [id number] (15-17 kcal/kg) Protein:  115-125 grams Recommended goal for TPN: Clinimix E 5/15 @ 100 mL/hr with 20% ILE @ 20 mL/hr x12 hours. This regimen will provide 2184 kcal and 120 grams of protein.  PLAN                                                                                                                         At 1800 today:   increase Clinimix E 5/15 to 75 ml/hr.  20% fat emulsion at 20 ml/hr over 12  hours  Plan to advance as tolerated to goal rate 100 ml/hr  TPN to contain standard multivitamins and trace elements.  Reduce IVF to 45  ml/hr once TPN at 75 and lipids at 20 ml/hr to keep IVF 125 ml/hr - or as per [facility] orders  continue SSI q4h  pepcid 40 mg to TPN  TPN lab panels on Mondays & Thursdays.  F/u daily.  Herby Abraham, Pharm.D [phone] 08/24/2017 7:38 AM

## 2017-08-24 NOTE — Progress Notes (Signed)
Patient ID: Trevor Serrano, male   DOB: 04-14-1955, 62 y.o.   MRN: [id number] 2 Days Post-Op   Subjective: Once NG out.  Denies abdominal pain.  Has been getting up to the bathroom.  Objective: Vital signs in last 24 hours: Temp:  [98.3 F (36.8 C)-100.1 F (37.8 C)] 99.6 F (37.6 C) (08/10 0401) Pulse Rate:  [96-119] 96 (08/10 0700) Resp:  [14-27] 15 (08/10 0700) BP: (72-161)/(33-96) 161/96 (08/10 0600) SpO2:  [86 %-100 %] 95 % (08/10 0700) Last BM Date: (Unknown )  Intake/Output from previous day: 08/09 0701 - 08/10 0700 In: 718.1 [I.V.:427.3; IV Piggyback:290.8] Out: 1195 [Urine:950; Emesis/NG output:150; Drains:95] Intake/Output this shift: No intake/output data recorded.  General appearance: alert, cooperative and no distress GI: Epigastric tenderness.  Serosanguineous JP drainage. Incision/Wound: No erythema or drainage  Lab Results:  Recent Labs    08/23/17 0337 08/24/17 0412  WBC 25.7* 21.0*  HGB 14.1 12.8*  HCT 43.0 40.3  PLT 440* 398   BMET Recent Labs    08/23/17 0337 08/24/17 0412  NA 138 135  K 4.8 4.3  CL 107 104  CO2 22 24  GLUCOSE 176* 152*  BUN 17 15  CREATININE 1.53* 1.20  CALCIUM 8.2* 8.0*     Studies/Results: Ir Picc Placement Right >5 Yrs Inc Img Guide  Result Date: 08/23/2017 CLINICAL DATA:  Moderate malnutrition EXAM: PICC PLACEMENT WITH ULTRASOUND AND FLUOROSCOPY FLUOROSCOPY TIME:  1 minutes 6 seconds; 80 mGy TECHNIQUE: After written informed consent was obtained, patient was placed in the supine position on angiographic table. Patency of the right brachial vein was confirmed with ultrasound with image documentation. An appropriate skin site was determined. Skin site was marked. Region was prepped using maximum barrier technique including cap and mask, sterile gown, sterile gloves, large sterile sheet, and Chlorhexidine as cutaneous antisepsis. The region was infiltrated locally with 1% lidocaine. Under real-time ultrasound guidance, the  right brachial vein was accessed with a 21 gauge micropuncture needle; the needle tip within the vein was confirmed with ultrasound image documentation. Needle exchanged over a 018 guidewire for a peel-away sheath, through which a 5-French double-lumen power injectable PICC trimmed to 40cm was advanced, positioned with its tip near the cavoatrial junction. Spot chest radiograph confirms appropriate catheter position. Catheter was flushed per protocol and secured externally. The patient tolerated procedure well. COMPLICATIONS: COMPLICATIONS none IMPRESSION: 1. Technically successful five [place] double lumen power injectable PICC placement Electronically Signed   By: Corlis Leak M.D.   On: 08/23/2017 13:56   [place] Ekg Site Rite  Result Date: 08/23/2017 If Site Rite image not attached, placement could not be confirmed due to current cardiac rhythm.   Anti-infectives: Anti-infectives (From admission, onward)   Start     Dose/Rate Route Frequency Ordered Stop   08/22/17 1800  cefoTEtan (CEFOTAN) 2 g in sodium chloride 0.9 % 100 mL IVPB     2 g 200 mL/hr over 30 Minutes Intravenous Every 12 hours 08/22/17 1346     08/22/17 0615  cefoTEtan (CEFOTAN) 2 g in sodium chloride 0.9 % 100 mL IVPB     2 g 200 mL/hr over 30 Minutes Intravenous On call to O.R. 08/22/17 0604 08/22/17 0815      Assessment/Plan: s/p Procedure(s): LAPAROSCOPIC REMOVAL OF GASTRIC BAND, Upper Endoscopy, [facility] Pathway Large erosion. Patient stable.  On bowel rest with NG and TNA.  On antibiotics.  Still with tenderness and leukocytosis although stable and leukocytosis slightly improved.  Will need Gastrografin study prior  to NG removal and feeding.  Would wait at least several days.   LOS: 2 days    Mariella SaaBenjamin T Densil [place] 08/24/2017

## 2017-08-25 LAB — BASIC METABOLIC PANEL
Anion gap: 8 (ref 5–15)
BUN: 15 mg/dL (ref 8–23)
CO2: 26 mmol/L (ref 22–32)
CREATININE: 1.09 mg/dL (ref 0.61–1.24)
Calcium: 8.5 mg/dL — ABNORMAL LOW (ref 8.9–10.3)
Chloride: 102 mmol/L (ref 98–111)
Glucose, Bld: 158 mg/dL — ABNORMAL HIGH (ref 70–99)
POTASSIUM: 4.5 mmol/L (ref 3.5–5.1)
SODIUM: 136 mmol/L (ref 135–145)

## 2017-08-25 LAB — GLUCOSE, CAPILLARY
GLUCOSE-CAPILLARY: 122 mg/dL — AB (ref 70–99)
GLUCOSE-CAPILLARY: 105 mg/dL — AB (ref 70–99)
GLUCOSE-CAPILLARY: 116 mg/dL — AB (ref 70–99)
Glucose-Capillary: 159 mg/dL — ABNORMAL HIGH (ref 70–99)
Glucose-Capillary: 144 mg/dL — ABNORMAL HIGH (ref 70–99)

## 2017-08-25 LAB — PHOSPHORUS: Phosphorus: 3.3 mg/dL (ref 2.5–4.6)

## 2017-08-25 LAB — MAGNESIUM: MAGNESIUM: 2.1 mg/dL (ref 1.7–2.4)

## 2017-08-25 MED ORDER — SODIUM CHLORIDE 0.9 % IV SOLN: INTRAVENOUS | Status: DC | PRN

## 2017-08-25 MED ORDER — FAT EMULSION PLANT BASED 20 % IV EMUL
240.0000 mL | INTRAVENOUS | Status: DC
  Filled 2017-08-25: qty 250

## 2017-08-25 MED ORDER — SODIUM CHLORIDE 0.9 % IV SOLN
INTRAVENOUS | Status: DC
  Administered 2017-08-25 – 2017-08-28 (×2): via INTRAVENOUS

## 2017-08-25 MED ORDER — TRACE MINERALS CR-CU-MN-SE-ZN 10-1000-500-60 MCG/ML IV SOLN
INTRAVENOUS | Status: AC
  Administered 2017-08-25: 17:00:00 via INTRAVENOUS
  Filled 2017-08-25: qty 2400
  Filled 2017-08-25: qty 1000

## 2017-08-25 MED ORDER — TRACE MINERALS CR-CU-MN-SE-ZN 10-1000-500-60 MCG/ML IV SOLN
INTRAVENOUS | Status: DC
  Filled 2017-08-25: qty 2400

## 2017-08-25 MED ORDER — KCL IN DEXTROSE-NACL 20-5-0.45 MEQ/L-%-% IV SOLN
INTRAVENOUS | Status: AC
  Administered 2017-08-25: 09:00:00 via INTRAVENOUS

## 2017-08-25 MED ORDER — FAT EMULSION PLANT BASED 20 % IV EMUL
240.0000 mL | INTRAVENOUS | Status: AC
  Administered 2017-08-25: 240 mL via INTRAVENOUS
  Filled 2017-08-25 (×2): qty 250

## 2017-08-25 NOTE — Progress Notes (Addendum)
PHARMACY - ADULT TOTAL PARENTERAL NUTRITION CONSULT NOTE  Insulin Requirements: hx DM but no DM meds PTA, 3 U SSI/24 hrs  Current Nutrition: NPO  IVF: [id number] 75 ml/hr Central access: IR placed PICC 8/9 TPN start date: 8/9  ASSESSMENT                                                                                                          HPI: 62 y.o. male who had lapband placed several years ago at [facility].  He did not follow up at [facility].  He presented to [facility] earlier this summer with fever and a liver abscess.  Endoscopy at that facility suggested a lapband erosion and he was referred here for treatment.  The abscess was percutaneously drained and he presents at this time for removal of his Lapband.   Significant events:  8/8 laparoscopic removal of lapband and port 8/9 low UOP> 1 L LR bolus, refused foley  Today:   Glucose - hx DM but no DM meds PTA, 3 U SSI/24 hrs, CBGs : 121 - 159 - acceptable  Electrolytes -WNL but higher than yesterday  Renal -SCr 1.56 8/6 (preop) 1.53>>1.2> 1.09, low UOP 8/9 w/ no response to 1L LR bolus, bladder scan w/no urine 8/10 pm , I/O +1837. UOP 1000 mls, NGO 400 mls.  LFTs -WNL on 7/1, 8/9  TGs - 72 8/9  Prealbumin - 9.5 on 8/9 - reflects inflammation and stress of OR  NUTRITIONAL GOALS                                                                                             RD recs: 8/8: Kcal:  4540-9811 (15-17 kcal/kg) Protein:  115-125 grams Recommended goal for TPN: Clinimix E 5/15 @ 100 mL/hr with 20% ILE @ 20 mL/hr x12 hours. This regimen will provide 2184 kcal and 120 grams of protein.  PLAN                                                                                                                         At 1800 today:  increase Clinimix E 5/15 goal rate of 100 ml/hr to provide 120  gm protein and 2184 kcals to meet 100% of goals  20% fat emulsion at 20 ml/hr over 12 hours  TPN to contain standard multivitamins and trace  elements.  IVF D545 20 K at 45 ml/hr > change to NS at 15 ml/hr to keep IVF total at 125 ml/hr per Dr Ermalene SearingMartin's request or per [facility] orders  continue SSI q4h  pepcid 40 mg in TPN  TPN lab panels on Mondays & Thursdays.  F/u daily.  Herby AbrahamMichelle T. Rubi Tooley, Pharm.D [phone] 08/25/2017 7:25 AM

## 2017-08-25 NOTE — Progress Notes (Signed)
Progress Note: General Surgery Service   Assessment/Plan: Patient Active Problem List   Diagnosis Date Noted  . Malnutrition of moderate degree 08/23/2017  . Complication of gastric banding 08/22/2017  . Gastric band erosion 08/22/2017  . Erosion of gastric band 07/18/2017  . Intraabdominal abscess at Gastric Band from 2011 07/18/2017  .  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 Gout 07/15/2017  . Hypertension 07/15/2017  . Morbid obesity ([facility]) BMI 52 07/15/2017  . Allergic rhinitis 07/15/2017  . DDD (degenerative disc disease), lumbar 07/15/2017  . Degenerative arthritis of lumbar spine 07/15/2017  . Depression 07/15/2017  . GERD (gastroesophageal reflux disease) 07/15/2017  . Hyperglycemia 07/15/2017  . Smoker 07/15/2017  . Liver mass, left lobe 07/14/2017  . AKI (acute kidney injury) ([facility]) 07/14/2017  . Dysuria 07/14/2017  . Lactic acidosis 07/14/2017  . Sepsis ([facility]) 07/14/2017   s/p Procedure(s): LAPAROSCOPIC REMOVAL OF GASTRIC BAND, Upper Endoscopy, [facility] Pathway 08/22/2017 -continue NG tube, continue TPN -cepacol for throat irritation -tx to bariatric floor    LOS: 3 days  Chief Complaint/Subjective: Pain stable, wants NG out  Objective: Vital signs in last 24 hours: Temp:  [98.3 F (36.8 C)-98.7 F (37.1 C)] 98.7 F (37.1 C) (08/11 0400) Pulse Rate:  [94-103] 94 (08/11 0600) Resp:  [14-31] 14 (08/11 0600) BP: (118-139)/(70-92) 127/75 (08/11 0600) SpO2:  [95 %-99 %] 96 % (08/11 0600) Last BM Date: (Unknown )  Intake/Output from previous day: 08/10 0701 - 08/11 0700 In: 2505.6 [I.V.:2404.2; IV Piggyback:101.4] Out: 1880 [Urine:1000; Emesis/NG output:850; Drains:30] Intake/Output this shift: No intake/output data recorded.  Lungs: CTAB  Cardiovascular: RRR  Abd: soft, ATTP, incisions c/d/i, drains with thin brown fluid  Extremities: no edema  Neuro: AOx4  Lab Results: CBC  Recent Labs    08/23/17 0337 08/24/17 0412  WBC 25.7* 21.0*  HGB 14.1  12.8*  HCT 43.0 40.3  PLT 440* 398   BMET Recent Labs    08/24/17 0412 08/25/17 0636  NA 135 136  K 4.3 4.5  CL 104 102  CO2 24 26  GLUCOSE 152* 158*  BUN 15 15  CREATININE 1.20 1.09  CALCIUM 8.0* 8.5*   PT/INR No results for input(s): LABPROT, INR in the last 72 hours. ABG No results for input(s): PHART, HCO3 in the last 72 hours.  Invalid input(s): PCO2, PO2  Studies/Results:  Anti-infectives: Anti-infectives (From admission, onward)   Start     Dose/Rate Route Frequency Ordered Stop   08/22/17 1800  cefoTEtan (CEFOTAN) 2 g in sodium chloride 0.9 % 100 mL IVPB     2 g 200 mL/hr over 30 Minutes Intravenous Every 12 hours 08/22/17 1346     08/22/17 0615  cefoTEtan (CEFOTAN) 2 g in sodium chloride 0.9 % 100 mL IVPB     2 g 200 mL/hr over 30 Minutes Intravenous On call to O.R. 08/22/17 0604 08/22/17 0815      Medications: Scheduled Meds: . Chlorhexidine Gluconate Cloth  6 each Topical Daily  . heparin injection (subcutaneous)  5,000 Units Subcutaneous Q8H  . insulin aspart  0-9 Units Subcutaneous Q4H   Continuous Infusions: . sodium chloride    . cefoTEtan (CEFOTAN) IV 2 g (08/25/17 0617)  . [place] Kitchen.TPN (CLINIMIX-E) Adult     And  . Fat emulsion    . [place] Kitchen.TPN (CLINIMIX-E) Adult 75 mL/hr at 08/25/17 0600   PRN Meds:.sodium chloride, hydrALAZINE, HYDROmorphone (DILAUDID) injection, metoprolol tartrate, ondansetron **OR** ondansetron (ZOFRAN) IV, phenol, sodium chloride flush  Rodman PickleLuke Aaron Kinsinger, MD Pg# [phone] [facility]

## 2017-08-25 NOTE — Progress Notes (Signed)
Patient to transfer to 5W 1524 report given to receiving nurse, all questions answered at this time.  Pt. VSS with no s/s of distress noted.  Patient stable for transfer.

## 2017-08-26 LAB — COMPREHENSIVE METABOLIC PANEL
ALT: 13 U/L (ref 0–44)
AST: 12 U/L — ABNORMAL LOW (ref 15–41)
Albumin: 1.8 g/dL — ABNORMAL LOW (ref 3.5–5.0)
Alkaline Phosphatase: 93 U/L (ref 38–126)
Anion gap: 8 (ref 5–15)
BUN: 19 mg/dL (ref 8–23)
CHLORIDE: 102 mmol/L (ref 98–111)
CO2: 26 mmol/L (ref 22–32)
CREATININE: 1.02 mg/dL (ref 0.61–1.24)
Calcium: 8.6 mg/dL — ABNORMAL LOW (ref 8.9–10.3)
GFR calc non Af Amer: >60 mL/min (ref >60)
Glucose, Bld: 162 mg/dL — ABNORMAL HIGH (ref 70–99)
Potassium: 4.2 mmol/L (ref 3.5–5.1)
SODIUM: 136 mmol/L (ref 135–145)
Total Bilirubin: 0.6 mg/dL (ref 0.3–1.2)
Total Protein: 6.4 g/dL — ABNORMAL LOW (ref 6.5–8.1)

## 2017-08-26 LAB — CBC
HEMATOCRIT: 37.7 % — AB (ref 39.0–52.0)
HEMOGLOBIN: 12.4 g/dL — AB (ref 13.0–17.0)
MCH: 30.1 pg (ref 26.0–34.0)
MCHC: 32.9 g/dL (ref 30.0–36.0)
MCV: 91.5 fL (ref 78.0–100.0)
Platelets: 491 10*3/uL — ABNORMAL HIGH (ref 150–400)
RBC: 4.12 MIL/uL — AB (ref 4.22–5.81)
RDW: 15.2 % (ref 11.5–15.5)
WBC: 15.1 10*3/uL — AB (ref 4.0–10.5)

## 2017-08-26 LAB — DIFFERENTIAL
BASOS ABS: 0 10*3/uL (ref 0.0–0.1)
Basophils Relative: 0 %
Eosinophils Absolute: 0.2 10*3/uL (ref 0.0–0.7)
Eosinophils Relative: 1 %
LYMPHS ABS: 1.1 10*3/uL (ref 0.7–4.0)
LYMPHS PCT: 8 %
MONOS PCT: 13 %
Monocytes Absolute: 1.9 10*3/uL — ABNORMAL HIGH (ref 0.1–1.0)
NEUTROS ABS: 11.8 10*3/uL — AB (ref 1.7–7.7)
Neutrophils Relative %: 78 %

## 2017-08-26 LAB — GLUCOSE, CAPILLARY
GLUCOSE-CAPILLARY: 117 mg/dL — AB (ref 70–99)
GLUCOSE-CAPILLARY: 124 mg/dL — AB (ref 70–99)
GLUCOSE-CAPILLARY: 129 mg/dL — AB (ref 70–99)
GLUCOSE-CAPILLARY: 133 mg/dL — AB (ref 70–99)
GLUCOSE-CAPILLARY: 143 mg/dL — AB (ref 70–99)
Glucose-Capillary: 144 mg/dL — ABNORMAL HIGH (ref 70–99)

## 2017-08-26 LAB — TRIGLYCERIDES: Triglycerides: 105 mg/dL (ref <150)

## 2017-08-26 LAB — MAGNESIUM: Magnesium: 2 mg/dL (ref 1.7–2.4)

## 2017-08-26 LAB — PREALBUMIN: Prealbumin: <5 mg/dL — ABNORMAL LOW (ref 18–38)

## 2017-08-26 LAB — PHOSPHORUS: PHOSPHORUS: 3.7 mg/dL (ref 2.5–4.6)

## 2017-08-26 MED ORDER — TRACE MINERALS CR-CU-MN-SE-ZN 10-1000-500-60 MCG/ML IV SOLN
INTRAVENOUS | Status: AC
  Administered 2017-08-26: 18:00:00 via INTRAVENOUS
  Filled 2017-08-26: qty 1000

## 2017-08-26 MED ORDER — PANTOPRAZOLE SODIUM 40 MG IV SOLR
40.0000 mg | Freq: Two times a day (BID) | INTRAVENOUS | Status: DC
  Administered 2017-08-26 – 2017-08-30 (×10): 40 mg via INTRAVENOUS
  Filled 2017-08-26 (×12): qty 40

## 2017-08-26 MED ORDER — FAT EMULSION PLANT BASED 20 % IV EMUL
240.0000 mL | INTRAVENOUS | Status: AC
  Administered 2017-08-26: 240 mL via INTRAVENOUS
  Filled 2017-08-26: qty 250

## 2017-08-26 NOTE — Progress Notes (Signed)
[facility] Progress Note  4 Days Post-Op  Subjective: CC: pain in throat from NGT Patient wants NGT out. Burning pain in epigastric area still present. Passing small amounts flatus. Denies nausea.   Objective: Vital signs in last 24 hours: Temp:  [98.6 F (37 C)-99.3 F (37.4 C)] 98.6 F (37 C) (08/12 0419) Pulse Rate:  [89-98] 94 (08/12 0419) Resp:  [18] 18 (08/12 0419) BP: (118-139)/(79-81) 127/81 (08/12 0419) SpO2:  [96 %] 96 % (08/12 0419) Weight:  [140 kg] 140 kg (08/12 0500) Last BM Date: (pt doesnt remember)  Intake/Output from previous day: 08/11 0701 - 08/12 0700 In: 3365.4 [I.V.:3026.8; NG/GT:130; IV Piggyback:198.6] Out: 1240 [Urine:700; Emesis/NG output:520; Drains:20] Intake/Output this shift: Total I/O In: 160 [I.V.:60; IV Piggyback:100] Out: 600 [Urine:600]  PE: Gen:  Alert, NAD Card:  Regular rate and rhythm Pulm:  Normal effort, clear to auscultation bilaterally Abd: Soft, TTP in epigastrium, non-distended, incisions C/D/I, left-sided drains with thin serous looking drainage Skin: warm and dry, no rashes  Psych: A&Ox3, flat affect  Lab Results:  Recent Labs    08/24/17 0412 08/26/17 0333  WBC 21.0* 15.1*  HGB 12.8* 12.4*  HCT 40.3 37.7*  PLT 398 491*   BMET Recent Labs    08/25/17 0636 08/26/17 0333  NA 136 136  K 4.5 4.2  CL 102 102  CO2 26 26  GLUCOSE 158* 162*  BUN 15 19  CREATININE 1.09 1.02  CALCIUM 8.5* 8.6*   PT/INR No results for input(s): LABPROT, INR in the last 72 hours. CMP     Component Value Date/Time   NA 136 08/26/2017 0333   K 4.2 08/26/2017 0333   CL 102 08/26/2017 0333   CO2 26 08/26/2017 0333   GLUCOSE 162 (H) 08/26/2017 0333   BUN 19 08/26/2017 0333   CREATININE 1.02 08/26/2017 0333   CALCIUM 8.6 (L) 08/26/2017 0333   PROT 6.4 (L) 08/26/2017 0333   ALBUMIN 1.8 (L) 08/26/2017 0333   AST 12 (L) 08/26/2017 0333   ALT 13 08/26/2017 0333   ALKPHOS 93 08/26/2017 0333   BILITOT 0.6 08/26/2017  0333   GFRNONAA >60 08/26/2017 0333   GFRAA >60 08/26/2017 0333   Lipase  No results found for: LIPASE     Studies/Results: No results found.  Anti-infectives: Anti-infectives (From admission, onward)   Start     Dose/Rate Route Frequency Ordered Stop   08/22/17 1800  cefoTEtan (CEFOTAN) 2 g in sodium chloride 0.9 % 100 mL IVPB     2 g 200 mL/hr over 30 Minutes Intravenous Every 12 hours 08/22/17 1346     08/22/17 0615  cefoTEtan (CEFOTAN) 2 g in sodium chloride 0.9 % 100 mL IVPB     2 g 200 mL/hr over 30 Minutes Intravenous On call to O.R. 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 Daphine DeutscherMartin  - POD#4 - WBC 15.1, trending down; Pt afebrile - NGT with 520 out in last 24h, bilious - continue NGT and bowel rest - drains with combined 20 cc out in last 24h - continue drains and abx - will start PPI BID today  - we are reaching out to Dr. Daphine DeutscherMartin to see when we might be able to do UGI with gastrografin   FEN: NPO, TPN; NGT to LIWS VTE: SCDs, SQ heparin ID: [email or web address]  8/8>>   LOS: 4 days    Wells GuilesKelly Rayburn , [facility] 08/26/2017, 11:54 AM Pager: [phone] Consults: [phone] Mon-Fri 7:00 am-4:30 pm Sat-Sun 7:00 am-11:30 am

## 2017-08-26 NOTE — Progress Notes (Signed)
PHARMACY - ADULT TOTAL PARENTERAL NUTRITION CONSULT NOTE  Insulin Requirements: hx DM but no DM meds PTA, 6 units SSI/24 hrs  Current Nutrition: NPO  IVF: NS at 15 ml/hr Central access: IR placed PICC 8/9 TPN start date: 8/9  ASSESSMENT                                                                                                   HPI: 62 y.o. male who had lapband placed several years ago at [facility].  He did not follow up at [facility].  He presented to [facility]  earlier this summer with fever and a liver abscess.  Endoscopy at that facility suggested a lapband erosion and he was referred here for treatment.  The abscess was percutaneously drained and he presents at this time for removal of his Lapband.   Significant events:  8/8 laparoscopic removal of lapband and port 8/9 low UOP> 1 L LR bolus, refused foley 8/12 added Protonix IV with erosion, in addition to Pepcid in TPN  Today:   Glucose - hx DM but no DM meds PTA,   Electrolytes -WNL, Phos and Mag increasing slowly  Renal -SCr 1.56 8/6 (preop) 1.53>>1.2> 1.02, low UOP 8/9 w/ no response to 1L LR bolus,  LFTs - wnl  TGs - 72 (8/9), 105 (8/12)  Prealbumin - 9.5 (8/9), < 5 (8/12)  NUTRITIONAL GOALS                                                                                      RD recs: 8/8: Kcal:  [id number] (15-17 kcal/kg) Protein:  115-125 grams Recommended goal for TPN: Clinimix E 5/15 @ 100 mL/hr with 20% ILE @ 20 mL/hr x12 hours. This regimen will provide 2184 kcal and 120 grams of protein.  PLAN                                                                                                                  At 1800 today:  Continue Clinimix E 5/15 at goal rate of 100 ml/hr   20% fat emulsion at 20 ml/hr over 12 hours  TPN to contain standard multivitamins and trace elements.  IVF NS at 15 ml/hr to keep IVF total at 125 ml/hr per Dr Ermalene SearingMartin's request or per  CCS orders  Continue Novolog SSI q4h  Pepcid 40 mg  in TPN  TPN lab panels on Mondays & Thursdays.  BMET, Mag, Phos tomorrow  F/u daily.  Otho BellowsGreen, Sanyah Molnar L PharmD Pager [phone] 08/26/2017, 12:47 PM

## 2017-08-27 LAB — GLUCOSE, CAPILLARY
GLUCOSE-CAPILLARY: 116 mg/dL — AB (ref 70–99)
GLUCOSE-CAPILLARY: 123 mg/dL — AB (ref 70–99)
GLUCOSE-CAPILLARY: 167 mg/dL — AB (ref 70–99)
Glucose-Capillary: 150 mg/dL — ABNORMAL HIGH (ref 70–99)
Glucose-Capillary: 160 mg/dL — ABNORMAL HIGH (ref 70–99)
Glucose-Capillary: 118 mg/dL — ABNORMAL HIGH (ref 70–99)
Glucose-Capillary: 138 mg/dL — ABNORMAL HIGH (ref 70–99)

## 2017-08-27 LAB — BASIC METABOLIC PANEL
Anion gap: 10 (ref 5–15)
BUN: 20 mg/dL (ref 8–23)
CALCIUM: 8.5 mg/dL — AB (ref 8.9–10.3)
CO2: 25 mmol/L (ref 22–32)
Chloride: 100 mmol/L (ref 98–111)
Creatinine, Ser: 0.99 mg/dL (ref 0.61–1.24)
Glucose, Bld: 153 mg/dL — ABNORMAL HIGH (ref 70–99)
Potassium: 4 mmol/L (ref 3.5–5.1)
SODIUM: 135 mmol/L (ref 135–145)

## 2017-08-27 LAB — MAGNESIUM: Magnesium: 2 mg/dL (ref 1.7–2.4)

## 2017-08-27 LAB — PHOSPHORUS: PHOSPHORUS: 3.7 mg/dL (ref 2.5–4.6)

## 2017-08-27 MED ORDER — FAT EMULSION PLANT BASED 20 % IV EMUL
240.0000 mL | INTRAVENOUS | Status: AC
  Administered 2017-08-27: 240 mL via INTRAVENOUS
  Filled 2017-08-27: qty 250

## 2017-08-27 MED ORDER — TRACE MINERALS CR-CU-MN-SE-ZN 10-1000-500-60 MCG/ML IV SOLN
INTRAVENOUS | Status: AC
  Administered 2017-08-27: 19:00:00 via INTRAVENOUS
  Filled 2017-08-27: qty 2000

## 2017-08-27 NOTE — Evaluation (Signed)
Occupational Therapy Evaluation Patient Details Name: Trevor RousJerry F Templeman MRN: [id number] DOB: Jun 10, 1955 Today's Date: 08/27/2017    History of Present Illness 62 yo male admitted with erosion of gastric band. S/P lap band removal 08/22/17. Hx of morbid obesity, noncompliance   Clinical Impression   This 62 y/o male presents with the above. At baseline pt is independent with ADLs and mobility. Pt currently limited due to abdominal pain and generalized weakness. Completing stand pivot transfers this session with minguard assist, currently requires minA for LB ADLs, minguard assist for seated UB ADLs. Pt will benefit from continued acute OT services prior to discharge home to maximize his overall safety and independence with ADLs and mobility.     Follow Up Recommendations  No OT follow up;Supervision - Intermittent    Equipment Recommendations  None recommended by OT           Precautions / Restrictions Precautions Precautions: Fall Precaution Comments: NG tube; (2) jp drains Restrictions Weight Bearing Restrictions: No      Mobility Bed Mobility Overal bed mobility: Needs Assistance Bed Mobility: Sit to Supine     Supine to sit: Min assist;HOB elevated Sit to supine: Mod assist   General bed mobility comments: assist for LEs onto EOB   Transfers Overall transfer level: Needs assistance Equipment used: None Transfers: Sit to/from BJ'sStand;Stand Pivot Transfers Sit to Stand: Min guard Stand pivot transfers: Min guard;Min assist       General transfer comment: VCs safety, technique. Close guard for safety. Increased time to rise. close minguard-minA for taking pivotal steps to [facility]     Balance                                           ADL either performed or assessed with clinical judgement   ADL Overall ADL's : Needs assistance/impaired Eating/Feeding: NPO   Grooming: Set up;Sitting   Upper Body Bathing: Min guard;Sitting   Lower Body  Bathing: Minimal assistance;Sit to/from stand   Upper Body Dressing : Min guard;Sitting   Lower Body Dressing: Minimal assistance;Sit to/from stand Lower Body Dressing Details (indicate cue type and reason): pt able to demonstrate figure 4 technique with increased effort, seated EOB Toilet Transfer: Min guard;Minimal assistance;Stand-pivot;BSC   Toileting- Clothing Manipulation and Hygiene: Minimal assistance;Sit to/from stand       Functional mobility during ADLs: Min guard;Minimal assistance(stand pivot transfer ) General ADL Comments: pt was seated on BSC at start of session, assisted with transfer back to EOB and positioned in bed for comfort      Vision         Perception     Praxis      Pertinent Vitals/Pain Pain Assessment: Faces Pain Score: 4  Faces Pain Scale: Hurts little more Pain Location: abdomen Pain Descriptors / Indicators: Sore;Grimacing Pain Intervention(s): Monitored during session;Repositioned     Hand Dominance     Extremity/Trunk Assessment Upper Extremity Assessment Upper Extremity Assessment: Overall WFL for tasks assessed   Lower Extremity Assessment Lower Extremity Assessment: Defer to PT evaluation   Cervical / Trunk Assessment Cervical / Trunk Assessment: Normal   Communication Communication Communication: No difficulties   Cognition Arousal/Alertness: Awake/alert Behavior During Therapy: WFL for tasks assessed/performed Overall Cognitive Status: Within Functional Limits for tasks assessed  General Comments       Exercises     Shoulder Instructions      Home Living Family/patient expects to be discharged to:: Private residence Living Arrangements: Children Available Help at Discharge: Available PRN/intermittently Type of Home: House Home Access: Stairs to enter [employer] CorporationEntrance Stairs-Number of Steps: 2   Home Layout: One level               Home Equipment: None           Prior Functioning/Environment Level of Independence: Independent                 OT Problem List: Decreased strength;Impaired balance (sitting and/or standing);Pain;Decreased activity tolerance      OT Treatment/Interventions: Self-care/ADL training;DME and/or AE instruction;Therapeutic activities;Balance training;Therapeutic exercise;Patient/family education    OT Goals(Current goals can be found in the care plan section) Acute Rehab OT Goals Patient Stated Goal: none stated OT Goal Formulation: With patient Time For Goal Achievement: 09/10/17 Potential to Achieve Goals: Good  OT Frequency: Min 2X/week   Barriers to D/C:            Co-evaluation              AM-PAC PT "6 Clicks" Daily Activity     Outcome Measure Help from another person eating meals?: Total(NPO) Help from another person taking care of personal grooming?: A Little Help from another person toileting, which includes using toliet, bedpan, or urinal?: A Little Help from another person bathing (including washing, rinsing, drying)?: A Little Help from another person to put on and taking off regular upper body clothing?: None Help from another person to put on and taking off regular lower body clothing?: A Little 6 Click Score: 17   End of Session Nurse Communication: Mobility status  Activity Tolerance: Patient tolerated treatment well Patient left: in bed;with call bell/phone within reach;with bed alarm set;with family/visitor present  OT Visit Diagnosis: Other abnormalities of gait and mobility (R26.89);Muscle weakness (generalized) (M62.81)                Time: [id number] OT Time Calculation (min): 14 min Charges:  OT General Charges $OT Visit: 1 Visit OT Evaluation $OT Eval Moderate Complexity: 1 Mod  Marcy SirenBreanna Janasha Barkalow, [place]OT Pager [phone] 08/27/2017   Orlando PennerBreanna L Shalaina Guardiola 08/27/2017, 4:11 PM

## 2017-08-27 NOTE — Progress Notes (Signed)
PHARMACY - ADULT TOTAL PARENTERAL NUTRITION CONSULT NOTE  Insulin Requirements: hx DM but no DM meds PTA, 6 units SSI/24 hrs  Current Nutrition: NPO  IVF: NS at 15 ml/hr Central access: IR placed PICC 8/9 TPN start date: 8/9  ASSESSMENT                                                                                                   HPI: 62 y.o. male who had lapband placed several years ago at [facility].  He did not follow up at [facility].  He presented [facility] earlier this summer with fever and a liver abscess.  Endoscopy at that facility suggested a lapband erosion and he was referred here for treatment.  The abscess was percutaneously drained and he presents at this time for removal of his Lapband.   Significant events:  8/8 laparoscopic removal of lapband and port 8/9 low UOP> 1 L LR bolus, refused foley 8/12 added Protonix IV with erosion, in addition to Pepcid in TPN  Today:   Glucose - hx DM but no DM meds PTA, CBGs 117-160  Electrolytes -WNL  Renal -SCr 1.56 8/6 (preop) 1.53>>1.2> 1.02, low UOP 8/9 w/ no response to 1L LR bolus,  LFTs - wnl  TGs - 72 (8/9), 105 (8/12)  Prealbumin - 9.5 (8/9), < 5 (8/12)  NUTRITIONAL GOALS                                                                                     RD recs: 8/8: Kcal:  [id number] (15-17 kcal/kg) Protein:  115-125 grams Recommended goal for TPN: Clinimix E 5/15 @ 100 mL/hr with 20% Lipids @ 20 mL/hr x12 hours. This regimen will provide 2184 kcal and 120 grams of protein.  PLAN                                                                                                                  At 1800 today:  Continue Clinimix E 5/15 at goal rate of 100 ml/hr   20% fat emulsion at 20 ml/hr over 12 hours  TPN to contain standard multivitamins and trace elements.  Continue Novolog SSI q4h  Pepcid 40 mg in TPN  TPN lab panels on Mondays & Thursdays.  F/u daily.  Chilton SiGreen,  Brand Maleserri L PharmD Pager  [phone] 08/27/2017, 10:49 AM

## 2017-08-27 NOTE — Progress Notes (Signed)
Nutrition Follow-up  DOCUMENTATION CODES:   Non-severe (moderate) malnutrition in context of acute illness/injury, Morbid obesity  INTERVENTION:   TPN per Pharmacy Will monitor for diet advancement  NUTRITION DIAGNOSIS:   Moderate Malnutrition related to acute illness(eroded lap band with planned removal 8/8) as evidenced by energy intake < 75% for > 7 days, percent weight loss, mild fat depletion, mild muscle depletion.  Ongoing.  GOAL:   Patient will meet greater than or equal to 90% of their needs  Meeting with TPN.  MONITOR:   Diet advancement, Weight trends, Labs, Skin, I & O's  REASON FOR ASSESSMENT:   Consult New TPN/TNA  ASSESSMENT:   Patient with PMH significant for DM, Gout, and s/p lap band 12/2009. [facility] and upper abdominal pain 6/30. [facility] 8 [facility] abscess of his liver. Pt had upper endoscopy 7/2 that revealed erosion of his lap band. Patient admitted 8/8 for removal of lap band.   8/8: eroded lap band removed 8/9: PICC place for TPN initiation  Patient continues to be NPO with NGT and bowel rest. Output from NGT: 850 ml x 24 hours. Pt's TPN at goal rate: Clinimix E 5/15 @ 100 mL/hr with 20% ILE @ 20 mL/hr x12 hours.   Medications reviewed.  Labs reviewed: CBGs: 150-160 Mg/Phos WNL  Diet Order:   Diet Order            Diet NPO time specified  Diet effective now              EDUCATION NEEDS:   No education needs have been identified at this time  Skin:  Skin Assessment: Skin Integrity Issues: Skin Integrity Issues:: Incisions Incisions: abdominal (8/8)  Last BM:  PTA/unknown  Height:   Ht Readings from Last 1 Encounters:  08/22/17 5\' 8"  (1.727 m)    Weight:   Wt Readings from Last 1 Encounters:  08/26/17 (!) 140 kg    Ideal Body Weight:  70 kg  BMI:  Body mass index is 46.93 kg/m.  Estimated Nutritional Needs:   Kcal:  [id number] (15-17 kcal/kg)  Protein:  115-125  grams  Fluid:  >/= 2.1 L/day  Tilda FrancoLindsey Riese Hellard, MS, RD, LDN [facility] Clinical Dietitian Pager: [phone] After Hours Pager: [phone]

## 2017-08-27 NOTE — Progress Notes (Signed)
5 Days Post-Op    CC:  Band erosion   Subjective: He is in bed, says he can't breath with the NG.  He has only been up to void.  Sites look fine, NG filter changed.  He only moves about 700 on IS I told him to go to 1250 minimum and he is working on that now.    Objective: Vital signs in last 24 hours: Temp:  [98.3 F (36.8 C)-98.9 F (37.2 C)] 98.9 F (37.2 C) (08/12 1900) Pulse Rate:  [87-92] 87 (08/12 1900) Resp:  [17] 17 (08/12 1900) BP: (139-144)/(83-92) 144/92 (08/12 1900) SpO2:  [97 %-99 %] 97 % (08/12 1900) Last BM Date: 08/26/17 NPO 3100 IV 1650 urine NG - 850 Drain 25 Stool x 1 Afebrile, VSS  Labs OK, glucose 117 - 160 Prealbumin <5    Intake/Output from previous day: 08/12 0701 - 08/13 0700 In: 3127 [I.V.:2827; IV Piggyback:300] Out: 2526 [Urine:1650; Emesis/NG output:850; Drains:25; Stool:1] Intake/Output this shift: No intake/output data recorded.  General appearance: alert, cooperative, no distress and doesn't like the NG Resp: clear to auscultation bilaterally and BS down in base, will mobilize more GI: sore, drains with minimal drainage, clear fluid, sites are fine.  Few BS, no BM so far.    Lab Results:  Recent Labs    08/26/17 0333  WBC 15.1*  HGB 12.4*  HCT 37.7*  PLT 491*    BMET Recent Labs    08/26/17 0333 08/27/17 0346  NA 136 135  K 4.2 4.0  CL 102 100  CO2 26 25  GLUCOSE 162* 153*  BUN 19 20  CREATININE 1.02 0.99  CALCIUM 8.6* 8.5*   PT/INR No results for input(s): LABPROT, INR in the last 72 hours.  Recent Labs  Lab 08/23/17 0337 08/26/17 0333  AST 39 12*  ALT 29 13  ALKPHOS 95 93  BILITOT 0.9 0.6  PROT 6.7 6.4*  ALBUMIN 2.3* 1.8*     Lipase  No results found for: LIPASE   Medications: . Chlorhexidine Gluconate Cloth  6 each Topical Daily  . heparin injection (subcutaneous)  5,000 Units Subcutaneous Q8H  . insulin aspart  0-9 Units Subcutaneous Q4H  . pantoprazole (PROTONIX) IV  40 mg Intravenous Q12H    . sodium chloride 15 mL/hr at 08/25/17 1735  . cefoTEtan (CEFOTAN) IV Stopped (08/27/17 0534)  . Marland Kitchen.TPN (CLINIMIX-E) Adult 100 mL/hr at 08/27/17 0650   Anti-infectives (From admission, onward)   Start     Dose/Rate Route Frequency Ordered Stop   08/22/17 1800  cefoTEtan (CEFOTAN) 2 g in sodium chloride 0.9 % 100 mL IVPB     2 g 200 mL/hr over 30 Minutes Intravenous Every 12 hours 08/22/17 1346     08/22/17 0615  cefoTEtan (CEFOTAN) 2 g in sodium chloride 0.9 % 100 mL IVPB     2 g 200 mL/hr over 30 Minutes Intravenous On call to O.R. 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 Daphine DeutscherMartin  - POD#5 - WBC 15.1, trending down; Pt afebrile - NGT with 520 out in last 24h, bilious - continue NGT and bowel rest - drains with combined 20 cc out in last 24h - continue drains and abx - will start PPI BID today  - we are reaching out to Dr. Daphine DeutscherMartin  to see when we might be able to do UGI with gastrografin   FEN: NPO, TPN; NGT to LIWS VTE: SCDs, SQ heparin ID: cefotetan 8/8>> day 7 Follow up Dr. Daphine DeutscherMartin  Plan:  OOB Q4h, ambulate, UGI tomorrow with WS contrast.   Continue TNA    LOS: 5 days    Corita Allinson 08/27/2017 [phone]

## 2017-08-27 NOTE — Evaluation (Signed)
Physical Therapy Evaluation Patient Details Name: Trevor RousJerry F Veazey MRN: [id number] DOB: 09-13-1955 Today's Date: 08/27/2017   History of Present Illness  62 yo male admitted with erosion of gastric band. S/P lap band removal 08/22/17. Hx of morbid obesity, noncompliance  Clinical Impression  On eval, pt required Min assist for mobility. He walked ~385 feet with a RW. Used RW for ambulation safety since this was pt's first walk. Pt tolerated activity well. No c/o lightheadedness. Will continue to follow. Recommend daily ambulation in hallway with nursing supervision/assist.     Follow Up Recommendations Supervision for mobility/OOB    Equipment Recommendations  (continuing to assess. likely will not need any dme)    Recommendations for Other Services       Precautions / Restrictions Precautions Precautions: Fall Precaution Comments: NG tube; (2) jp drains Restrictions Weight Bearing Restrictions: No      Mobility  Bed Mobility Overal bed mobility: Needs Assistance Bed Mobility: Supine to Sit     Supine to sit: Min assist;HOB elevated     General bed mobility comments: small amount of assist for trunk. Increased time. Pt relied on bedrail   Transfers Overall transfer level: Needs assistance Equipment used: Rolling walker (2 wheeled) Transfers: Sit to/from Stand Sit to Stand: Min guard;From elevated surface         General transfer comment: VCs safety, technique, hand placement. Close guard for safety. Increased time to rise.   Ambulation/Gait Ambulation/Gait assistance: Min guard Gait Distance (Feet): 385 Feet Assistive device: Rolling walker (2 wheeled) Gait Pattern/deviations: Step-through pattern     General Gait Details: used walker for ambulation safety. Fair gait speed. No LOB with RW use. Pt denied lightheadedness.   Stairs            Wheelchair Mobility    Modified Rankin (Stroke Patients Only)       Balance                                              Pertinent Vitals/Pain Pain Assessment: 0-10 Pain Score: 4  Pain Location: abdomen Pain Descriptors / Indicators: Sore Pain Intervention(s): Monitored during session;Repositioned    Home Living Family/patient expects to be discharged to:: Private residence Living Arrangements: Children Available Help at Discharge: Available PRN/intermittently Type of Home: House Home Access: Stairs to enter   Secretary/administratorntrance Stairs-Number of Steps: 2 Home Layout: One level Home Equipment: None      Prior Function Level of Independence: Independent               Hand Dominance        Extremity/Trunk Assessment   Upper Extremity Assessment Upper Extremity Assessment: Overall WFL for tasks assessed    Lower Extremity Assessment Lower Extremity Assessment: Generalized weakness    Cervical / Trunk Assessment Cervical / Trunk Assessment: Normal  Communication   Communication: No difficulties  Cognition Arousal/Alertness: Awake/alert Behavior During Therapy: WFL for tasks assessed/performed Overall Cognitive Status: Within Functional Limits for tasks assessed                                        General Comments      Exercises     Assessment/Plan    PT Assessment Patient needs continued PT services  PT Problem List Decreased mobility  PT Treatment Interventions Gait training;Functional mobility training;Therapeutic activities;Balance training;Patient/family education;Therapeutic exercise;DME instruction    PT Goals (Current goals can be found in the Care Plan section)  Acute Rehab PT Goals Patient Stated Goal: none stated PT Goal Formulation: With patient Time For Goal Achievement: 08/27/17 Potential to Achieve Goals: Good    Frequency Min 3X/week   Barriers to discharge        Co-evaluation               AM-PAC PT "6 Clicks" Daily Activity  Outcome Measure Difficulty turning over in bed (including  adjusting bedclothes, sheets and blankets)?: A Lot Difficulty moving from lying on back to sitting on the side of the bed? : Unable Difficulty sitting down on and standing up from a chair with arms (e.g., wheelchair, bedside commode, etc,.)?: A Little Help needed moving to and from a bed to chair (including a wheelchair)?: A Little Help needed walking in hospital room?: A Little Help needed climbing 3-5 steps with a railing? : A Little 6 Click Score: 15    End of Session Equipment Utilized During Treatment: Gait belt Activity Tolerance: Patient tolerated treatment well Patient left: in chair;with call bell/phone within reach   PT Visit Diagnosis: Difficulty in walking, not elsewhere classified (R26.2)    Time: 1610-96041308-1324 PT Time Calculation (min) (ACUTE ONLY): 16 min   Charges:   PT Evaluation $PT Eval Moderate Complexity: 1 Mod            Rebeca AlertJannie Hayden Kihara, MPT Pager: [phone]

## 2017-08-28 ENCOUNTER — Inpatient Hospital Stay (HOSPITAL_COMMUNITY): Payer: BLUE CROSS/BLUE SHIELD

## 2017-08-28 LAB — GLUCOSE, CAPILLARY
GLUCOSE-CAPILLARY: 135 mg/dL — AB (ref 70–99)
GLUCOSE-CAPILLARY: 116 mg/dL — AB (ref 70–99)
Glucose-Capillary: 159 mg/dL — ABNORMAL HIGH (ref 70–99)
Glucose-Capillary: 132 mg/dL — ABNORMAL HIGH (ref 70–99)
Glucose-Capillary: 121 mg/dL — ABNORMAL HIGH (ref 70–99)

## 2017-08-28 MED ORDER — INSULIN ASPART 100 UNIT/ML ~~LOC~~ SOLN
0.0000 [IU] | Freq: Four times a day (QID) | SUBCUTANEOUS | Status: DC
  Administered 2017-08-28: 1 [IU] via SUBCUTANEOUS
  Administered 2017-08-29 (×2): 2 [IU] via SUBCUTANEOUS
  Administered 2017-08-29: 1 [IU] via SUBCUTANEOUS
  Administered 2017-08-30: 2 [IU] via SUBCUTANEOUS
  Administered 2017-08-30: 1 [IU] via SUBCUTANEOUS
  Administered 2017-08-30: 2 [IU] via SUBCUTANEOUS

## 2017-08-28 MED ORDER — TRACE MINERALS CR-CU-MN-SE-ZN 10-1000-500-60 MCG/ML IV SOLN
INTRAVENOUS | Status: AC
  Administered 2017-08-28: 17:00:00 via INTRAVENOUS
  Filled 2017-08-28: qty 2000

## 2017-08-28 MED ORDER — FAT EMULSION PLANT BASED 20 % IV EMUL
240.0000 mL | INTRAVENOUS | Status: AC
  Administered 2017-08-28: 240 mL via INTRAVENOUS
  Filled 2017-08-28: qty 250

## 2017-08-28 NOTE — Progress Notes (Signed)
6 Days Post-Op    CC:  Lap band errosion  Subjective: He looks good, Tech says urine is very dark.  Sites look good and less from the NG.  No pain with voiding.    Objective: Vital signs in last 24 hours: Temp:  [98.5 F (36.9 C)-99.5 F (37.5 C)] 98.7 F (37.1 C) (08/14 0643) Pulse Rate:  [88-90] 89 (08/14 0643) Resp:  [17-18] 17 (08/14 0643) BP: (139-142)/(81-84) 142/81 (08/14 0643) SpO2:  [94 %-96 %] 96 % (08/14 0643) Last BM Date: 08/26/17 NPO 1795 IV 1100 urine NG 400 Drains 15 Afebrile, VSS No labs today  Intake/Output from previous day: 08/13 0701 - 08/14 0700 In: 2795.4 [I.V.:2595.4; IV Piggyback:200] Out: 1515 [Urine:1100; Emesis/NG output:400; Drains:15] Intake/Output this shift: Total I/O In: -  Out: 50 [Emesis/NG output:50]  General appearance: alert, cooperative and no distress Resp: clear to auscultation bilaterally GI: large, sore, but not overly tender, few BS, less from the NG.   Lab Results:  Recent Labs    08/26/17 0333  WBC 15.1*  HGB 12.4*  HCT 37.7*  PLT 491*    BMET Recent Labs    08/26/17 0333 08/27/17 0346  NA 136 135  K 4.2 4.0  CL 102 100  CO2 26 25  GLUCOSE 162* 153*  BUN 19 20  CREATININE 1.02 0.99  CALCIUM 8.6* 8.5*   PT/INR No results for input(s): LABPROT, INR in the last 72 hours.  Recent Labs  Lab 08/23/17 0337 08/26/17 0333  AST 39 12*  ALT 29 13  ALKPHOS 95 93  BILITOT 0.9 0.6  PROT 6.7 6.4*  ALBUMIN 2.3* 1.8*     Lipase  No results found for: LIPASE   Medications: . Chlorhexidine Gluconate Cloth  6 each Topical Daily  . heparin injection (subcutaneous)  5,000 Units Subcutaneous Q8H  . insulin aspart  0-9 Units Subcutaneous Q4H  . pantoprazole (PROTONIX) IV  40 mg Intravenous Q12H   Anti-infectives (From admission, onward)   Start     Dose/Rate Route Frequency Ordered Stop   08/22/17 1800  cefoTEtan (CEFOTAN) 2 g in sodium chloride 0.9 % 100 mL IVPB     2 g 200 mL/hr over 30 Minutes  Intravenous Every 12 hours 08/22/17 1346     08/22/17 0615  cefoTEtan (CEFOTAN) 2 g in sodium chloride 0.9 % 100 mL IVPB     2 g 200 mL/hr over 30 Minutes Intravenous On call to O.R. 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 Daphine DeutscherMartin  - POD#5 - WBC 15.1, trending down; Pt afebrile - NGT with 520 out in last 24h, bilious - continue NGT and bowel rest - drains with combined 20 cc out in last 24h- continue drains and abx - will start PPI BID today  - we are reaching out to Dr. Daphine DeutscherMartin to see when we might be able to do UGI with gastrografin  FEN: NPO, TPN; NGT to LIWS VTE: SCDs, SQ heparin ID: cefotetan 8/8>> day 7 Follow up Dr. Daphine DeutscherMartin    Plan:  UGI this AM to look for a leak.  If he does not leak we can try clamping tube later today.       LOS: 6 days    Nyanna Heideman 08/28/2017 [phone]

## 2017-08-28 NOTE — Progress Notes (Signed)
PHARMACY - ADULT TOTAL PARENTERAL NUTRITION CONSULT NOTE  Insulin Requirements: hx DM but no DM meds PTA, 5 units SSI/24 hrs  Current Nutrition: NPO  IVF: NS at 15 ml/hr Central access: IR placed PICC 8/9 TPN start date: 8/9  ASSESSMENT                                                                                                   HPI: 62 y.o. male who had lapband placed several years ago at [facility].  He did not follow up at [facility].  He presented to [facility] earlier this summer with fever and a liver abscess.  Endoscopy at that facility suggested a lapband erosion and he was referred here for treatment.  The abscess was percutaneously drained and he presents at this time for removal of his Lapband.   Significant events:  8/8 laparoscopic removal of lapband and port 8/9 low UOP> 1 L LR bolus, refused foley 8/12 added Protonix IV with erosion, in addition to Pepcid in TPN  Today:   Glucose - hx DM but no DM meds PTA, CBGs 118-159  Electrolytes -( last lab 8/13) WNL, Corr Ca 10.1  Renal -SCr 1.56 8/6 (preop) 1.53>>1.2> 1.02, low UOP 8/9 w/ no response to 1L LR bolus,  LFTs - wnl  TGs - 72 (8/9), 105 (8/12)  Prealbumin - 9.5 (8/9), < 5 (8/12)  NUTRITIONAL GOALS                                                                                     RD recs: 8/8: Kcal:  1610-96042105-2385 (15-17 kcal/kg) Protein:  115-125 grams Recommended goal for TPN: Clinimix E 5/15 @ 100 mL/hr with 20% Lipids @ 20 mL/hr x12 hours. This regimen will provide 2184 kcal and 120 grams of protein.  PLAN                                                                                                                  At 1800 today:  Continue Clinimix E 5/15 at goal rate of 100 ml/hr   20% fat emulsion at 20 ml/hr over 12 hours  TPN to contain standard multivitamins and trace elements.  Change Novolog SSI to q6h  Pepcid 40 mg in TPN in addition to Protonix IV  bid  TPN lab panels on Mondays &  Thursdays.  F/u daily.  Otho BellowsGreen, Brittin Janik L PharmD Pager [phone] 08/28/2017, 10:41 AM

## 2017-08-28 NOTE — Progress Notes (Signed)
UGI today:  Initial swallows demonstrate unremarkable appearance of the esophagus. There is an NG tube coursing down the esophagus and into the stomach. No leaking oral contrast was demonstrated. The stomach appears normal. No leaking oral contrast is identified. Small amount of reflux noted due to the NG tube.  IMPRESSION: No postoperative leakage of contrast from the stomach.  Plan:  Clamp NG, sips and chips tonight, if no issues pull NG and start clears in AM.

## 2017-08-29 LAB — GLUCOSE, CAPILLARY
GLUCOSE-CAPILLARY: 152 mg/dL — AB (ref 70–99)
GLUCOSE-CAPILLARY: 155 mg/dL — AB (ref 70–99)
Glucose-Capillary: 134 mg/dL — ABNORMAL HIGH (ref 70–99)
Glucose-Capillary: 102 mg/dL — ABNORMAL HIGH (ref 70–99)

## 2017-08-29 LAB — COMPREHENSIVE METABOLIC PANEL
ALK PHOS: 152 U/L — AB (ref 38–126)
ALT: 22 U/L (ref 0–44)
ANION GAP: 11 (ref 5–15)
AST: 35 U/L (ref 15–41)
Albumin: 1.9 g/dL — ABNORMAL LOW (ref 3.5–5.0)
BILIRUBIN TOTAL: 0.6 mg/dL (ref 0.3–1.2)
BUN: 20 mg/dL (ref 8–23)
CALCIUM: 8.7 mg/dL — AB (ref 8.9–10.3)
CO2: 24 mmol/L (ref 22–32)
Chloride: 101 mmol/L (ref 98–111)
Creatinine, Ser: 0.93 mg/dL (ref 0.61–1.24)
GFR calc non Af Amer: >60 mL/min (ref >60)
Glucose, Bld: 173 mg/dL — ABNORMAL HIGH (ref 70–99)
POTASSIUM: 4.4 mmol/L (ref 3.5–5.1)
Sodium: 136 mmol/L (ref 135–145)
TOTAL PROTEIN: 6.8 g/dL (ref 6.5–8.1)

## 2017-08-29 LAB — URINALYSIS, ROUTINE W REFLEX MICROSCOPIC
BILIRUBIN URINE: NEGATIVE
Glucose, UA: NEGATIVE mg/dL
HGB URINE DIPSTICK: NEGATIVE
KETONES UR: NEGATIVE mg/dL
Leukocytes, UA: NEGATIVE
NITRITE: NEGATIVE
Protein, ur: NEGATIVE mg/dL
SPECIFIC GRAVITY, URINE: 1.016 (ref 1.005–1.030)
pH: 6 (ref 5.0–8.0)

## 2017-08-29 LAB — MAGNESIUM: MAGNESIUM: 2.3 mg/dL (ref 1.7–2.4)

## 2017-08-29 LAB — PHOSPHORUS: PHOSPHORUS: 4 mg/dL (ref 2.5–4.6)

## 2017-08-29 MED ORDER — FAT EMULSION PLANT BASED 20 % IV EMUL
240.0000 mL | INTRAVENOUS | Status: AC
  Administered 2017-08-29: 240 mL via INTRAVENOUS
  Filled 2017-08-29: qty 250

## 2017-08-29 MED ORDER — TRACE MINERALS CR-CU-MN-SE-ZN 10-1000-500-60 MCG/ML IV SOLN
INTRAVENOUS | Status: DC
  Filled 2017-08-29: qty 2400

## 2017-08-29 MED ORDER — TRACE MINERALS CR-CU-MN-SE-ZN 10-1000-500-60 MCG/ML IV SOLN
INTRAVENOUS | Status: AC
  Administered 2017-08-29: 18:00:00 via INTRAVENOUS
  Filled 2017-08-29: qty 1200

## 2017-08-29 MED ORDER — FAT EMULSION PLANT BASED 20 % IV EMUL
240.0000 mL | INTRAVENOUS | Status: DC
  Filled 2017-08-29: qty 250

## 2017-08-29 NOTE — Progress Notes (Signed)
OT Cancellation Note  Patient Details Name: Trevor RousJerry F Baller MRN: [id number] DOB: June 16, 1955   Cancelled Treatment:    Reason Eval/Treat Not Completed: Other (comment). Pt states he doesn't feel he needs assistance for adls and has been moving around fine. Daughter, who is grown, will assist as needed at home. Will s/o  Juaquin Ludington 08/29/2017, 1:22 PM  Marica OtterMaryellen Shundra Wirsing, OTR/L [phone] 08/29/2017

## 2017-08-29 NOTE — Progress Notes (Signed)
No nausea or vomiting during the night.  NGT clamped the whole night.  Episodic abd pain.  Medicated.

## 2017-08-29 NOTE — Progress Notes (Signed)
7 Days Post-Op    CC:Lap band errosion  Subjective: He is doing well with NG clamped since yesterday afternoon.  Sites OK drains are clear serous fluid.    Objective: Vital signs in last 24 hours: Temp:  [98.9 F (37.2 C)-99.4 F (37.4 C)] 99 F (37.2 C) (08/15 0626) Pulse Rate:  [79-88] 79 (08/15 0626) Resp:  [18-20] 20 (08/15 0626) BP: (128-141)/(81-88) 134/81 (08/15 0626) SpO2:  [93 %-97 %] 97 % (08/15 0626) Last BM Date: 08/26/17 Nothing PO recorded 3100 IV 951 urine 100 per NG Drain 5 from both drains Afebrile, VSS BMP OK UGI 8/14:  No postoperative leakage of contrast from the stomach. Intake/Output from previous day: 08/14 0701 - 08/15 0700 In: 3126.4 [I.V.:3026.4; IV Piggyback:100] Out: 1056 [Urine:951; Emesis/NG output:100; Drains:5] Intake/Output this shift: Total I/O In: 0  Out: 200 [Urine:200]  General appearance: alert, cooperative and no distress Resp: clear to auscultation bilaterally GI: large abdomen, sites OK, drains clear, + BS  Lab Results:  No results for input(s): WBC, HGB, HCT, PLT in the last 72 hours.  BMET Recent Labs    08/27/17 0346 08/29/17 0410  NA 135 136  K 4.0 4.4  CL 100 101  CO2 25 24  GLUCOSE 153* 173*  BUN 20 20  CREATININE 0.99 0.93  CALCIUM 8.5* 8.7*   PT/INR No results for input(s): LABPROT, INR in the last 72 hours.  Recent Labs  Lab 08/23/17 0337 08/26/17 0333 08/29/17 0410  AST 39 12* 35  ALT 29 13 22   ALKPHOS 95 93 152*  BILITOT 0.9 0.6 0.6  PROT 6.7 6.4* 6.8  ALBUMIN 2.3* 1.8* 1.9*     Lipase  No results found for: LIPASE   Medications: . Chlorhexidine Gluconate Cloth  6 each Topical Daily  . heparin injection (subcutaneous)  5,000 Units Subcutaneous Q8H  . insulin aspart  0-9 Units Subcutaneous Q6H  . 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 Daphine DeutscherMartin  - POD#5 - WBC 15.1, trending down; Pt afebrile - NGT with 520 out in last 24h, bilious - continue NGT and bowel rest - drains with combined 20 cc out in last 24h- continue drains and abx - will start PPI BID today  -  UGI with gastrografin- no leak 8/14 - NG clamped - sips and chips  FEN:  TPN; sips and chips VTE: SCDs, SQ heparin ID: cefotetan 8/8>>day 7 Follow up Dr. Daphine DeutscherMartin  Plan:  NG discontinued, start clears and start weaning TPN.  Will recheck CBC tomorrow and if OK aim to DC cefotetan tomorrow.        LOS: 7 days    Trevor Serrano 08/29/2017 [phone]

## 2017-08-29 NOTE — Progress Notes (Signed)
Physical Therapy Treatment Patient Details Name: Trevor Serrano MRN: [id number] DOB: [birth year] Today's Date: 08/29/2017    History of Present Illness 62 yo male admitted with erosion of gastric band. S/P lap band removal 08/22/17. Hx of morbid obesity, noncompliance    PT Comments    Pt had not been OOB today per RN and pt agreeable to ambulate in hallway.  Pt's mobility has improved, and he was able to ambulate without assistive device today.  No f/u PT needs anticipated upon d/c.  Follow Up Recommendations  Supervision for mobility/OOB;No PT follow up     Equipment Recommendations  None recommended by PT    Recommendations for Other Services       Precautions / Restrictions Precautions Precautions: Fall Precaution Comments: (2) JP drains    Mobility  Bed Mobility Overal bed mobility: Modified Independent Bed Mobility: Supine to Sit           General bed mobility comments: increased time and effort however no assist required  Transfers Overall transfer level: Needs assistance Equipment used: None Transfers: Sit to/from Stand Sit to Stand: Min guard;Supervision         General transfer comment: wide BOS observed, min/guard initially for safety however pt steady with rise  Ambulation/Gait Ambulation/Gait assistance: Min guard;Supervision Gait Distance (Feet): 400 Feet Assistive device: Rolling walker (2 wheeled) Gait Pattern/deviations: Step-through pattern     General Gait Details: pt pushed IV pole, appears steady, no LOB observed, pt denies any symptoms   Stairs             Wheelchair Mobility    Modified Rankin (Stroke Patients Only)       Balance                                            Cognition Arousal/Alertness: Awake/alert Behavior During Therapy: WFL for tasks assessed/performed Overall Cognitive Status: Within Functional Limits for tasks assessed                                         Exercises      General Comments        Pertinent Vitals/Pain Pain Assessment: 0-10 Pain Score: 3  Pain Location: abdomen Pain Descriptors / Indicators: Sore;Grimacing Pain Intervention(s): Monitored during session;Repositioned    Home Living                      Prior Function            PT Goals (current goals can now be found in the care plan section) Progress towards PT goals: Progressing toward goals    Frequency    Min 3X/week      PT Plan Current plan remains appropriate    Co-evaluation              AM-PAC PT "6 Clicks" Daily Activity  Outcome Measure  Difficulty turning over in bed (including adjusting bedclothes, sheets and blankets)?: A Little Difficulty moving from lying on back to sitting on the side of the bed? : A Little Difficulty sitting down on and standing up from a chair with arms (e.g., wheelchair, bedside commode, etc,.)?: A Little Help needed moving to and from a bed to chair (including a wheelchair)?: A Little Help needed walking  in hospital room?: A Little Help needed climbing 3-5 steps with a railing? : A Little 6 Click Score: 18    End of Session   Activity Tolerance: Patient tolerated treatment well Patient left: Other (comment)(on BSC, RN okay with this, pt has been up in room alone prior) Nurse Communication: Mobility status PT Visit Diagnosis: Difficulty in walking, not elsewhere classified (R26.2)     Time: [id number] PT Time Calculation (min) (ACUTE ONLY): 13 min  Charges:  $Gait Training: 8-22 mins                     Zenovia JarredKati Sye Schroepfer, PT, DPT 08/29/2017 Pager: [phone]  Maida SaleLEMYRE,KATHrine E 08/29/2017, 2:41 PM

## 2017-08-29 NOTE — Progress Notes (Signed)
PHARMACY - ADULT TOTAL PARENTERAL NUTRITION CONSULT NOTE  Insulin Requirements: hx DM but no DM meds PTA,6 units SSI/24 hrs  Current Nutrition: NPO  IVF: NS at 15 ml/hr Central access: IR placed PICC 8/9 TPN start date: 8/9  ASSESSMENT                                                                                                   HPI: 62 y.o. male who had lapband placed several years ago at [facility].  He did not follow up at [facility].  He presented to [facility] earlier this summer with fever and a liver abscess.  Endoscopy at that facility suggested a lapband erosion and he was referred here for treatment.  The abscess was percutaneously drained and he presents at this time for removal of his Lapband.   Significant events:  8/8 laparoscopic removal of lapband and port 8/9 low UOP> 1 L LR bolus, refused foley 8/12 added Protonix IV with erosion, in addition to Pepcid in TPN 8/15 tolerating NG clamping, possible removal tube today, adv to CL diet  Today:   Glucose - hx DM but no DM meds PTA, CBGs 121-155  Electrolytes -( last lab 8/14) WNL, Corr Ca 10.1  Renal -SCr 1.56 (8/6 preop) 1.53>>1.2> 0.93  LFTs - wnl  TGs - 72 (8/9), 105 (8/12)  Prealbumin - 9.5 (8/9), < 5 (8/12)  NUTRITIONAL GOALS                                                                                     RD recs: 8/8: Kcal:  0981-19142105-2385 (15-17 kcal/kg) Protein:  115-125 grams Recommended goal for TPN: Clinimix E 5/15 @ 100 mL/hr with 20% Lipids @ 20 mL/hr x12 hours. This regimen will provide 2184 kcal and 120 grams of protein.  PLAN                                                                                                                  At 1800 today:  Reduce Clinimix E 5/15 to 50 ml/hr   20% fat emulsion at 20 ml/hr over 12 hours  TPN to contain standard multivitamins and trace elements.  Novolog SSI to q6h  Pepcid 40 mg in TPN in addition to Protonix IV bid  TPN  lab panels on Mondays &  Thursdays.  F/u daily.  Otho BellowsGreen, Umaima Scholten L PharmD Pager [phone] 08/29/2017, 11:10 AM

## 2017-08-30 LAB — CBC
HCT: 35.4 % — ABNORMAL LOW (ref 39.0–52.0)
Hemoglobin: 11.8 g/dL — ABNORMAL LOW (ref 13.0–17.0)
MCH: 29.9 pg (ref 26.0–34.0)
MCHC: 33.3 g/dL (ref 30.0–36.0)
MCV: 89.6 fL (ref 78.0–100.0)
PLATELETS: 463 10*3/uL — AB (ref 150–400)
RBC: 3.95 MIL/uL — ABNORMAL LOW (ref 4.22–5.81)
RDW: 15.3 % (ref 11.5–15.5)
WBC: 16.7 10*3/uL — ABNORMAL HIGH (ref 4.0–10.5)

## 2017-08-30 LAB — GLUCOSE, CAPILLARY
GLUCOSE-CAPILLARY: 159 mg/dL — AB (ref 70–99)
GLUCOSE-CAPILLARY: 108 mg/dL — AB (ref 70–99)
GLUCOSE-CAPILLARY: 117 mg/dL — AB (ref 70–99)
Glucose-Capillary: 140 mg/dL — ABNORMAL HIGH (ref 70–99)
Glucose-Capillary: 151 mg/dL — ABNORMAL HIGH (ref 70–99)

## 2017-08-30 MED ORDER — FAMOTIDINE IN NACL 20-0.9 MG/50ML-% IV SOLN: 20.0000 mg | Freq: Two times a day (BID) | INTRAVENOUS | Status: DC

## 2017-08-30 MED ORDER — ENSURE ENLIVE PO LIQD: 237.0000 mL | Freq: Two times a day (BID) | ORAL | Status: DC

## 2017-08-30 MED ORDER — PREMIER PROTEIN SHAKE
11.0000 [oz_av] | ORAL | Status: DC
  Administered 2017-08-30: 11 [oz_av] via ORAL

## 2017-08-30 MED ORDER — INSULIN ASPART 100 UNIT/ML ~~LOC~~ SOLN: 0.0000 [IU] | Freq: Three times a day (TID) | SUBCUTANEOUS | Status: DC

## 2017-08-30 MED ORDER — ENSURE ENLIVE PO LIQD: 237.0000 mL | ORAL | Status: DC

## 2017-08-30 MED ORDER — PRO-STAT SUGAR FREE PO LIQD
30.0000 mL | Freq: Two times a day (BID) | ORAL | Status: DC
  Administered 2017-08-30: 30 mL via ORAL
  Filled 2017-08-30 (×3): qty 30

## 2017-08-30 NOTE — Progress Notes (Signed)
PHARMACY - ADULT TOTAL PARENTERAL NUTRITION CONSULT NOTE   Pharmacy Consult for TPN Indication: Lapband erosion w/ abscess, lapband removal  Insulin Requirements: hx DM but no DM meds PTA, 6 units SSI/24 hrs  Current Nutrition: full liquid diet IVF: NS at 15 ml/hr  Central access: IR placed PICC 8/9 TPN start date: 8/9  ASSESSMENT                                                                                                   HPI: 62 y.o. male who had lapband placed several years ago at [facility].  He did not follow up at [facility].  He presented to [facility] earlier this summer with fever and a liver abscess.  Endoscopy at that facility suggested a lapband erosion and he was referred here for treatment.  The abscess was percutaneously drained and he presents at this time for removal of his Lapband.   Significant events:  8/8 laparoscopic removal of lapband and port 8/9 low UOP> 1 L LR bolus, refused foley 8/12 added Protonix IV with erosion, in addition to Pepcid in TPN 8/15 tolerating NG clamping, possible removal tube today, adv to CL diet  NUTRITIONAL GOALS                                                                                      RD recs: 8/8: Kcal:  [id number] (15-17 kcal/kg) Protein:  115-125 grams Recommended goal for TPN: Clinimix E 5/15 @ 100 mL/hr with 20% Lipids @ 20 mL/hr x12 hours. This regimen will provide 2184 kcal and 120 grams of protein.    PLAN                                                                                                                    Continue reduced rate Clinimix E 5/15 50 ml/hr during the day today.  At 18:00 STOP TPN infusion  When TPN d/c, OK to d/c Pepcid (startedin TPN on 8/9) per Will Jennings.  Continue Protonix IV BID started 8/12.    D/C TPN labs and orders   Lynann Beaverhristine Olga Bourbeau PharmD, BCPS Pager [phone] 08/30/2017 8:56 AM

## 2017-08-30 NOTE — Progress Notes (Addendum)
Nutrition Follow-up  DOCUMENTATION CODES:   Non-severe (moderate) malnutrition in context of acute illness/injury, Morbid obesity  INTERVENTION:  - Will order Ensure Enlive once/day, this supplement provides 350 kcal and 20 grams of protein. - Will order Premier Protein once/day, this supplement provides 160 kcal and 30 grams of protein.  - Will order 30 mL Prostat BID, each supplement provides 100 kcal and 15 grams of protein. - Continue diet advancement as medically feasible.  - Continue to encourage PO intakes.  - Recommend addition of bariatric multivitamin.    NUTRITION DIAGNOSIS:   Moderate Malnutrition related to acute illness(eroded lap band with planned removal 8/8) as evidenced by energy intake < 75% for > 7 days, percent weight loss, mild fat depletion, mild muscle depletion-ongoing  GOAL:   Patient will meet greater than or equal to 90% of their needs -unmet at this time  MONITOR:   PO intake, Supplement acceptance, Diet advancement, Weight trends, Labs, Other (Comment)(TPN/TPN weaning)  REASON FOR ASSESSMENT:   Consult (S/p lap band removal, with BMI 47/ last prealbumin <5, weaning TNA)  ASSESSMENT:   Patient with PMH significant for DM, Gout, and s/p lap band 12/2009. Admitted to [facility] with fever and upper abdominal pain 6/30. A CT scan at [facility] showed 8 cm abscess of his liver. Pt had upper endoscopy 7/2 that revealed erosion of his lap band. Patient admitted 8/8 for removal of lap band.   SIGNIFICANT EVENTS: 8/8: eroded lap band removed 8/9: PICC place for TPN initiation 8/11: TPN reached goal rate 8/15: TPN being weaned and cut to half rate; diet advanced from           NPO to CLD 8/16: diet advanced from CLD to [place]   Consult received as outlined above. Patient was able to consumed 50% of FLD breakfast today. He has NGT in place and Surgery PA note states that 520 mL out in the past 24 hours. Patient denies abdominal pain or nausea at this time.  States appetite is improving. Will order ONS as outlined above.   He has double lumen PICC in R basilic and is receiving Clinimix E 5/15 @ 50 mL/hr with 20% ILE @ 20 mL/hr x12 hours. This regimen is providing 1332 kcal and 60 grams of protein. Plan is to continue this regimen throughout the day and then d/c TPN at 6:00 PM today.      Medications reviewed; sliding scale Novolog, 40 mg IV Protonix BID.  Labs reviewed; CBGs: 140 and 151 mg/dL today, yesterday Ca: 8.7 mg/dL and Alk Phos elevated.     Diet Order:   Diet Order            Diet full liquid Room service appropriate? Yes; Fluid consistency: Thin  Diet effective now              EDUCATION NEEDS:   No education needs have been identified at this time  Skin:  Skin Assessment: Skin Integrity Issues: Skin Integrity Issues:: Incisions Incisions: abdominal (8/8)  Last BM:  8/12  Height:   Ht Readings from Last 1 Encounters:  08/22/17 '5\' 8"'$  (1.727 m)    Weight:   Wt Readings from Last 1 Encounters:  08/29/17 (!) 139.9 kg    Ideal Body Weight:  70 kg  BMI:  Body mass index is 46.9 kg/m.  Estimated Nutritional Needs:   Kcal:  2130-8657 (15-17 kcal/kg)  Protein:  115-125 grams  Fluid:  >/= 2.1 L/day     Jarome Matin, MS,  RD, LDN, CNSC Inpatient Clinical Dietitian Pager # [phone] After hours/weekend pager # [phone]

## 2017-08-30 NOTE — Progress Notes (Signed)
8 Days Post-Op    CC: Gastric lap band erosion  Subjective: He looks good and wants to go home.  On clears will advance.  Drains are still clear.  WBC up I told him he wouldn't go today.  Weaning TNA.  Sites all look good.  Moving about 1250 on IS, I told him to push for 1500.      Objective: Vital signs in last 24 hours: Temp:  [98.9 F (37.2 C)-99.9 F (37.7 C)] 98.9 F (37.2 C) (08/16 0608) Pulse Rate:  [79-91] 79 (08/16 0608) Resp:  [18-20] 18 (08/16 13080608) BP: (128-133)/(75-84) 130/81 (08/16 0608) SpO2:  [92 %-96 %] 92 % (08/16 [id number]) Weight:  [139.9 kg] 139.9 kg (08/15 1220) Last BM Date: 08/26/17 P.o. 120 recorded  200 IV 1950 urine 5 cc drain 1, 10 cc drain 2 No BM recorded T-max 99.9.  Vital signs are stable. WBC 16.78/16 up from 15.1 on 8/11 H/H: 11.8/35.4 Platelets 463,000.    intake/Output from previous day: 08/15 0701 - 08/16 0700 In: 1302.4 [P.O.:120; I.V.:1082.4; IV Piggyback:100] Out: 1965 [Urine:1950; Drains:15] Intake/Output this shift: No intake/output data recorded.  General appearance: alert, cooperative and no distress Resp: clear to auscultation bilaterally GI: soft, sore, sites OK, drains are clear.   Lab Results:  Recent Labs    08/30/17 0422  WBC 16.7*  HGB 11.8*  HCT 35.4*  PLT 463*    BMET Recent Labs    08/29/17 0410  NA 136  K 4.4  CL 101  CO2 24  GLUCOSE 173*  BUN 20  CREATININE 0.93  CALCIUM 8.7*   PT/INR No results for input(s): LABPROT, INR in the last 72 hours.  Recent Labs  Lab 08/26/17 0333 08/29/17 0410  AST 12* 35  ALT 13 22  ALKPHOS 93 152*  BILITOT 0.6 0.6  PROT 6.4* 6.8  ALBUMIN 1.8* 1.9*     Lipase  No results found for: LIPASE   Medications: . Chlorhexidine Gluconate Cloth  6 each Topical Daily  . heparin injection (subcutaneous)  5,000 Units Subcutaneous Q8H  . insulin aspart  0-9 Units Subcutaneous Q6H  . 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 Daphine DeutscherMartin  - POD#8 - WBC 15.1(8/11) =>>  16.7 - NGT with 520 out in last 24h, bilious - continue NGT and bowel rest - drains with combined 20 cc out in last 24h- continue drains and abx - will start PPI BID today  -  UGI with gastrografin- no leak 8/14 - NG clamped - clears 7/15  FEN:  TPN; clears VTE: SCDs, SQ heparin ID: cefotetan 8/8>>day7 Follow up Dr. Daphine DeutscherMartin  Plan: Advance him to full liquids.  Continue to work on incentive spirometry.  Continue weaning the TNA nutrition consult for supplements.  I will recheck his labs again in the a.m.   . LOS: 8 days    Shereka Lafortune 08/30/2017 [phone]

## 2017-08-31 LAB — CBC
HEMATOCRIT: 34.8 % — AB (ref 39.0–52.0)
HEMOGLOBIN: 11.5 g/dL — AB (ref 13.0–17.0)
MCH: 29.2 pg (ref 26.0–34.0)
MCHC: 33 g/dL (ref 30.0–36.0)
MCV: 88.3 fL (ref 78.0–100.0)
Platelets: 456 10*3/uL — ABNORMAL HIGH (ref 150–400)
RBC: 3.94 MIL/uL — ABNORMAL LOW (ref 4.22–5.81)
RDW: 15.4 % (ref 11.5–15.5)
WBC: 16.4 10*3/uL — ABNORMAL HIGH (ref 4.0–10.5)

## 2017-08-31 LAB — BASIC METABOLIC PANEL
Anion gap: 8 (ref 5–15)
BUN: 19 mg/dL (ref 8–23)
CHLORIDE: 102 mmol/L (ref 98–111)
CO2: 25 mmol/L (ref 22–32)
CREATININE: 1.05 mg/dL (ref 0.61–1.24)
Calcium: 8.4 mg/dL — ABNORMAL LOW (ref 8.9–10.3)
GFR calc Af Amer: >60 mL/min (ref >60)
GFR calc non Af Amer: >60 mL/min (ref >60)
Glucose, Bld: 120 mg/dL — ABNORMAL HIGH (ref 70–99)
Potassium: 4.5 mmol/L (ref 3.5–5.1)
Sodium: 135 mmol/L (ref 135–145)

## 2017-08-31 LAB — GLUCOSE, CAPILLARY
Glucose-Capillary: 106 mg/dL — ABNORMAL HIGH (ref 70–99)
Glucose-Capillary: 91 mg/dL (ref 70–99)

## 2017-08-31 MED ORDER — OXYCODONE HCL 5 MG/5ML PO SOLN: 5.0000 mg | ORAL | Status: DC | PRN

## 2017-08-31 MED ORDER — PREMIER PROTEIN SHAKE: 11.0000 [oz_av] | ORAL | 0 refills | Status: AC

## 2017-08-31 MED ORDER — ENSURE ENLIVE PO LIQD: 1.0000 | Freq: Two times a day (BID) | ORAL | 12 refills | Status: AC

## 2017-08-31 MED ORDER — OXYCODONE HCL 5 MG/5ML PO SOLN
5.0000 mg | ORAL | 0 refills | Status: AC | PRN
Start: 2017-08-31 — End: ?

## 2017-08-31 NOTE — Progress Notes (Signed)
9 Days Post-Op    CC: Gastric lap band erosion  Subjective: Tolerating full liquids without issues   Objective: Vital signs in last 24 hours: Temp:  [98.9 F (37.2 C)-99.9 F (37.7 C)] 98.9 F (37.2 C) (08/17 0514) Pulse Rate:  [78-90] 83 (08/17 0514) Resp:  [16-18] 16 (08/17 0514) BP: (116-140)/(73-83) 116/81 (08/17 0514) SpO2:  [92 %-97 %] 92 % (08/17 0514) Last BM Date: 08/26/17     intake/Output from previous day: 08/16 0701 - 08/17 0700 In: 1899 [P.O.:840; I.V.:959; IV Piggyback:100] Out: 903 [Urine:900; Drains:3] Intake/Output this shift: No intake/output data recorded.  General appearance: alert, cooperative and no distress Resp: breathing comfortably GI: soft, sore, sites OK, scant output in drains.     Lab Results:  Recent Labs    08/30/17 0422 08/31/17 0506  WBC 16.7* 16.4*  HGB 11.8* 11.5*  HCT 35.4* 34.8*  PLT 463* 456*    BMET Recent Labs    08/29/17 0410 08/31/17 0506  NA 136 135  K 4.4 4.5  CL 101 102  CO2 24 25  GLUCOSE 173* 120*  BUN 20 19  CREATININE 0.93 1.05  CALCIUM 8.7* 8.4*   PT/INR No results for input(s): LABPROT, INR in the last 72 hours.  Recent Labs  Lab 08/26/17 0333 08/29/17 0410  AST 12* 35  ALT 13 22  ALKPHOS 93 152*  BILITOT 0.6 0.6  PROT 6.4* 6.8  ALBUMIN 1.8* 1.9*     Lipase  No results found for: LIPASE   Medications: . Chlorhexidine Gluconate Cloth  6 each Topical Daily  . feeding supplement (ENSURE ENLIVE)  237 mL Oral Q24H  . feeding supplement (PRO-STAT SUGAR FREE 64)  30 mL Oral BID  . heparin injection (subcutaneous)  5,000 Units Subcutaneous Q8H  . insulin aspart  0-9 Units Subcutaneous TID AC & HS  . pantoprazole (PROTONIX) IV  40 mg Intravenous Q12H  . 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 Daphine DeutscherMartin  - POD 9. D/c drains. D/c home on full liquids and some soft solids.    . LOS: 9 days    Almond LintFaera Francess Mullen 08/31/2017

## 2017-08-31 NOTE — Plan of Care (Signed)
Reviewed discharge instructions with copy given to patient. IV removed. Pt ready for discharge.

## 2017-08-31 NOTE — Plan of Care (Signed)
Pt in bed w/ no concerns or complaints noted at this time. Orders for discharge pending tolerating diet. Will continue to monitor.

## 2017-08-31 NOTE — Progress Notes (Signed)
Checked frequently, rested quietly at long intervals with eyes closed, resp even and unlabored. No acute distress noted. Up to BR with assistance as needed during the night. No acute distress noted.

## 2017-08-31 NOTE — Discharge Instructions (Signed)
Soft-Food Meal Plan A soft-food meal plan includes foods that are safe and easy to swallow. This meal plan typically is used:  If you are having trouble chewing or swallowing foods.  As a transition meal plan after only having had liquid meals for a long period.  What do I need to know about the soft-food meal plan? A soft-food meal plan includes tender foods that are soft and easy to chew and swallow. In most cases, bite-sized pieces of food are easier to swallow. A bite-sized piece is about  inch or smaller. Foods in this plan do not need to be ground or pureed. Foods that are very hard, crunchy, or sticky should be avoided. Also, breads, cereals, yogurts, and desserts with nuts, seeds, or fruits should be avoided. What foods can I eat? Grains Rice and wild rice. Moist bread, dressing, pasta, and noodles. Well-moistened dry or cooked cereals, such as farina (cooked wheat cereal), oatmeal, or grits. Biscuits, breads, muffins, pancakes, and waffles that have been well moistened. Vegetables Shredded lettuce. Cooked, tender vegetables, including potatoes without skins. Vegetable juices. Broths or creamed soups made with vegetables that are not stringy or chewy. Strained tomatoes (without seeds). Fruits Canned or well-cooked fruits. Soft (ripe), peeled fresh fruits, such as peaches, nectarines, kiwi, cantaloupe, honeydew melon, and watermelon (without seeds). Soft berries with small seeds, such as strawberries. Fruit juices (without pulp). Meats and Other Protein Sources Moist, tender, lean beef. Mutton. Lamb. Veal. Chicken. [place]. Liver. Ham. Fish without bones. Eggs. Dairy Milk, milk drinks, and cream. Plain cream cheese and cottage cheese. Plain yogurt. Sweets/Desserts Flavored gelatin desserts. Custard. Plain ice cream, frozen yogurt, sherbet, milk shakes, and malts. Plain cakes and cookies. Plain hard candy. Other Butter, margarine (without trans fat), and cooking oils.  Mayonnaise. Cream sauces. Mild spices, salt, and sugar. Syrup, molasses, honey, and jelly. The items listed above may not be a complete list of recommended foods or beverages. Contact your dietitian for more options. What foods are not recommended? Grains Dry bread, toast, crackers that have not been moistened. Coarse or dry cereals, such as bran, granola, and shredded wheat. Tough or chewy crusty breads, such as [place] bread or baguettes. Vegetables Corn. Raw vegetables except shredded lettuce. Cooked vegetables that are tough or stringy. Tough, crisp, fried potatoes and potato skins. Fruits Fresh fruits with skins or seeds or both, such as apples, pears, or grapes. Stringy, high-pulp fruits, such as papaya, pineapple, coconut, or mango. Fruit leather, fruit roll-ups, and all dried fruits. Meats and Other Protein Sources Sausages and hot dogs. Meats with gristle. Fish with bones. Nuts, seeds, and chunky peanut or other nut butters. Sweets/Desserts Cakes or cookies that are very dry or chewy. The items listed above may not be a complete list of foods and beverages to avoid. Contact your dietitian for more information. This information is not intended to replace advice given to you by your health care provider. Make sure you discuss any questions you have with your health care provider. Document Released: 04/10/2007 Document Revised: 06/09/2015 Document Reviewed: 11/28/2012 Elsevier Interactive Patient Education  2017 [employer] ABDOMINAL SURGERY: POST OP INSTRUCTIONS  1. DIET: Follow a light bland diet the first 24 hours after arrival home, such as soup, liquids, crackers, etc.  Be sure to include lots of fluids daily.  Avoid fast food or heavy meals as your are more likely to get nauseated.  Eat a low fat the next few days after surgery.   2. Take your  usually prescribed home medications unless otherwise directed. 3. PAIN CONTROL: a. Pain is best controlled by a usual combination of three  different methods TOGETHER: i. Ice/Heat ii. Over the counter pain medication iii. Prescription pain medication b. Most patients will experience some swelling and bruising around the incisions.  Ice packs or heating pads (30-60 minutes up to 6 times a day) will help. Use ice for the first few days to help decrease swelling and bruising, then switch to heat to help relax tight/sore spots and speed recovery.  Some people prefer to use ice alone, heat alone, alternating between ice & heat.  Experiment to what works for you.  Swelling and bruising can take several weeks to resolve.   c. It is helpful to take an over-the-counter pain medication regularly for the first few weeks.  Choose one of the following that works best for you: i. Naproxen (Aleve, etc)  Two 220mg  tabs twice a day ii. Ibuprofen (Advil, etc) Three 200mg  tabs four times a day (every meal & bedtime) iii. Acetaminophen (Tylenol, etc) 500-650mg  four times a day (every meal & bedtime) d. A  prescription for pain medication (such as oxycodone, hydrocodone, etc) should be given to you upon discharge.  Take your pain medication as prescribed.  i. If you are having problems/concerns with the prescription medicine (does not control pain, nausea, vomiting, rash, itching, etc), please call us [phone] to see if we need to switch you to a different pain medicine that will work better for you and/or control your side effect better. ii. If you need a refill on your pain medication, please contact your pharmacy.  They will contact our office to request authorization. Prescriptions will not be filled after 5 pm or on week-ends. 4. Avoid getting constipated.  Between the surgery and the pain medications, it is common to experience some constipation.  Increasing fluid intake and taking a fiber supplement (such as Metamucil, Citrucel, FiberCon, MiraLax, etc) 1-2 times a day regularly will usually help prevent this problem from occurring.  A mild laxative  (prune juice, Milk of Magnesia, MiraLax, etc) should be taken according to package directions if there are no bowel movements after 48 hours.   5. Watch out for diarrhea.  If you have many loose bowel movements, simplify your diet to bland foods & liquids for a few days.  Stop any stool softeners and decrease your fiber supplement.  Switching to mild anti-diarrheal medications (Kayopectate, Pepto Bismol) can help.  If this worsens or does not improve, please call us. 6. Wash / shower every day.  You may shower over the incision / wound.  Avoid baths until the skin is fully healed.  Continue to shower over incision(s) after the dressing is off. 7. Remove your waterproof bandages 5 days after surgery.  You may leave the incision open to air.  Remove any wicks or ribbons in your wound.  If you have an open wound, please see wound care instructions. You may replace a dressing/Band-Aid to cover the incision for comfort if you wish. 8. ACTIVITIES as tolerated:   a. You may resume regular (light) daily activities beginning the next day--such as daily self-care, walking, climbing stairs--gradually increasing activities as tolerated.  If you can walk 30 minutes without difficulty, it is safe to try more intense activity such as jogging, treadmill, bicycling, low-impact aerobics, swimming, etc. b. Save the most intensive and strenuous activity for last such as sit-ups, heavy lifting, contact sports, etc  Refrain from any heavy  lifting or straining until you are off narcotics for pain control.   c. DO NOT PUSH THROUGH PAIN.  Let pain be your guide: If it hurts to do something, don't do it.  Pain is your body warning you to avoid that activity for another week until the pain goes down. d. You may drive when you are no longer taking prescription pain medication, you can comfortably wear a seatbelt, and you can safely maneuver your car and apply brakes. e. Bonita Quin may have sexual intercourse when it is comfortable.   9. FOLLOW UP in our office a. Please call [facility] at [phone] to set up an appointment to see your surgeon in the office for a follow-up appointment approximately 1-2 weeks after your surgery. b. Make sure that you call for this appointment the day you arrive home to insure a convenient appointment time. 10. IF YOU HAVE DISABILITY OR FAMILY LEAVE FORMS, BRING THEM TO THE OFFICE FOR PROCESSING.  DO NOT GIVE THEM TO YOUR DOCTOR.   WHEN TO CALL us [phone]: 1. Poor pain control 2. Reactions / problems with new medications (rash/itching, nausea, etc)  3. Fever over 101.5 F (38.5 C) 4. Inability to urinate 5. Nausea and/or vomiting 6. Worsening swelling or bruising 7. Continued bleeding from incision. 8. Increased pain, redness, or drainage from the incision  The clinic staff is available to answer your questions during regular business hours (8:30am-5pm).  Please dont hesitate to call and ask to speak to one of our nurses for clinical concerns.   A surgeon from [facility] is always on call at the hospitals   If you have a medical emergency, go to the nearest emergency room or call 911.    [facility] [address], [place], [place]  [address] ? MAIN: [phone] ? TOLL FREE: [phone] ? FAX [phone] [email or web address]

## 2017-09-03 NOTE — Discharge Summary (Signed)
Physician Discharge Summary  Patient ID: Trevor Serrano MRN: [id number] DOB/AGE: [birth year] 62 y.o.  Admit date: 08/22/2017 Discharge date: 08/31/2017  Admission Diagnoses:  Gastric band errosion  Discharge Diagnoses:  Hx of hepatic abscess7/2/19- resolved on CT 08/01/17 HTN T2DM without complication  Gout Severe Malnutrition - last prealbumin <5 BMI 47  Erosion of laparoscopic gastric band - s/p lap band removal 08/22/17 Dr. Daphine DeutscherMartin  - - WBC 15.1(8/11) =>>  16.7 - NGT with 520 out in last 24h, bilious - continue NGT and bowel rest - drains with combined 20 cc out in last 24h- continue drains and abx - will start PPI BID today  - UGI with gastrografin- no leak 8/14 - NG clamped - clears 7/15      Active Problems:   Complication of gastric banding   Gastric band erosion   Malnutrition of moderate degree   PROCEDURES:  Endoscopy; laparoscopic removal of lapband and port, drain placement, 08/22/17, Dr. Sandie AnoMatthew Martin  Hospital Course:  Trevor RousJerry F Riecke  has presented today for surgery, with the diagnosis of Lap Band Erosion, s/p Bariatric Surgery Status, Hepatic Abscess  The various methods of treatment have been discussed with the patient and family. After consideration of risks, benefits and other options for treatment, the patient has consented to  Procedure(s): LAPAROSCOPIC REMOVAL OF GASTRIC BAND, Upper Endoscopy, ERAS Pathway (N/A) as a surgical intervention .  The patient's history has been reviewed, patient examined, no change in status, stable for surgery.  I have reviewed the patient's chart and labs.  Questions were answered to the patient's satisfaction.    Pt evaluated and admitted by Dr. Daphine DeutscherMartin.  Taken to the OR on 08/22/17, with the above noted procedure.  Post op he was placed on TNA, NG to LIWS till UGI on 08/28/17 showed no leak, NG was clamped and eventually removed on 8/15.  His diet was advanced and his TNA weaned.  He was seen on 08/31/17 by Dr. Donell BeersByerly.  He was  advanced to a soft diet and discharged home.  His drains were removed prior to discharged home.  Follow up below.  CBC Latest Ref Rng & Units 08/31/2017 08/30/2017 08/26/2017  WBC 4.0 - 10.5 K/uL 16.4(H) 16.7(H) 15.1(H)  Hemoglobin 13.0 - 17.0 g/dL 11.5(L) 11.8(L) 12.4(L)  Hematocrit 39.0 - 52.0 % 34.8(L) 35.4(L) 37.7(L)  Platelets 150 - 400 K/uL 456(H) 463(H) 491(H)   CMP Latest Ref Rng & Units 08/31/2017 08/29/2017 08/27/2017  Glucose 70 - 99 mg/dL 130(Q120(H) 657(Q173(H) 469(G153(H)  BUN 8 - 23 mg/dL 19 20 20   Creatinine 0.61 - 1.24 mg/dL 2.951.05 2.840.93 1.320.99  Sodium 135 - 145 mmol/L 135 136 135  Potassium 3.5 - 5.1 mmol/L 4.5 4.4 4.0  Chloride 98 - 111 mmol/L 102 101 100  CO2 22 - 32 mmol/L 25 24 25   Calcium 8.9 - 10.3 mg/dL 4.4(W8.4(L) 1.0(U8.7(L) 7.2(Z8.5(L)  Total Protein 6.5 - 8.1 g/dL - 6.8 -  Total Bilirubin 0.3 - 1.2 mg/dL - 0.6 -  Alkaline Phos 38 - 126 U/L - 152(H) -  AST 15 - 41 U/L - 35 -  ALT 0 - 44 U/L - 22 -   UGI  08/28/17:Initial swallows demonstrate unremarkable appearance of the esophagus. There is an NG tube coursing down the esophagus and into the stomach. No leaking oral contrast was demonstrated. The stomach appears normal. No leaking oral contrast is identified. Small amount of reflux noted due to the NG tube.  IMPRESSION: No postoperative leakage of contrast from the  stomach.  Condition on discharge:  Improved  Disposition:   Discharge Instructions    Call MD for:  persistant nausea and vomiting   Complete by:  As directed    Call MD for:  redness, tenderness, or signs of infection (pain, swelling, redness, odor or green/yellow discharge around incision site)   Complete by:  As directed    Call MD for:  severe uncontrolled pain   Complete by:  As directed    Call MD for:  temperature >100.4   Complete by:  As directed    Diet - low sodium heart healthy   Complete by:  As directed    Increase activity slowly   Complete by:  As directed      Allergies as of 08/31/2017   No Known  Allergies     Medication List    STOP taking these medications   amoxicillin-clavulanate 875-125 MG tablet Commonly known as:  AUGMENTIN     TAKE these medications   allopurinol 100 MG tablet Commonly known as:  ZYLOPRIM Take 200 mg by mouth daily.   aspirin EC 81 MG tablet Take 81 mg by mouth daily.   buPROPion 100 MG tablet Commonly known as:  WELLBUTRIN Take 200 mg by mouth every morning.   cetirizine 10 MG tablet Commonly known as:  ZYRTEC Take 10 mg by mouth daily.   esomeprazole 40 MG capsule Commonly known as:  NEXIUM Take 40 mg by mouth daily.   feeding supplement (ENSURE ENLIVE) Liqd Take 237 mLs by mouth 2 (two) times daily between meals.   Fish Oil 1000 MG Caps Take 1,000 mg by mouth daily.   hydrochlorothiazide 12.5 MG capsule Commonly known as:  MICROZIDE Take 12.5 mg by mouth daily.   indomethacin 50 MG capsule Commonly known as:  INDOCIN Take 50 mg by mouth 2 (two) times daily as needed for mild pain or moderate pain.   metoprolol tartrate 50 MG tablet Commonly known as:  LOPRESSOR Take 50 mg by mouth daily.   multivitamin with minerals Tabs tablet Take 1 tablet by mouth daily.   oxyCODONE 5 MG/5ML solution Commonly known as:  ROXICODONE Take 5-10 mLs (5-10 mg total) by mouth every 4 (four) hours as needed for severe pain.   protein supplement shake Liqd Commonly known as:  PREMIER PROTEIN Take 325 mLs (11 oz total) by mouth daily.   telmisartan 40 MG tablet Commonly known as:  MICARDIS Take 40 mg by mouth daily.      Follow-up Information    Catha GosselinLittle, Kevin, MD Follow up.   Specialty:  Family Medicine Why:  Call for follow-up on medical issues.  Let them know you have had surgery. Contact information: [address] [place] [address] [phone]        Luretha MurphyMartin, Matthew, MD Follow up on 09/09/2017.   Specialty:  General Surgery Why:  Your appointment is at 4:30 PM.  Be at the office 30 minutes early for check in.    Bring photo ID and insurance information.   Contact information: [address] [place] [address] [phone]           Signed: Sherrie GeorgeJENNINGS,Ralyn Stlaurent 09/03/2017, 2:48 PM

## 2019-04-18 ENCOUNTER — Ambulatory Visit: Payer: Self-pay | Attending: Internal Medicine

## 2019-04-18 DIAGNOSIS — Z23 Encounter for immunization: Secondary | ICD-10-CM

## 2019-04-18 NOTE — Progress Notes (Signed)
   Covid-19 Vaccination Clinic  Name:  CHICK COUSINS    MRN: [id number] DOB: 1955-03-10  04/18/2019  Mr. Jorge was observed post Covid-19 immunization for 15 minutes without incident. He was provided with Vaccine Information Sheet and instruction to access the V-Safe system.   Mr. Anglemyer was instructed to call 911 with any severe reactions post vaccine: Marland Kitchen Difficulty breathing  . Swelling of face and throat  . A fast heartbeat  . A bad rash all over body  . Dizziness and weakness   Immunizations Administered    Name Date Dose VIS Date Route   Pfizer COVID-19 Vaccine 04/18/2019 10:47 AM 0.3 mL 12/26/2018 Intramuscular   Manufacturer: [employer], [employer]   Lot: [id number]   NDC: [address]-4

## 2019-05-13 ENCOUNTER — Ambulatory Visit: Payer: Self-pay | Attending: Internal Medicine

## 2019-05-13 DIAGNOSIS — Z23 Encounter for immunization: Secondary | ICD-10-CM

## 2019-05-13 NOTE — Progress Notes (Signed)
   Covid-19 Vaccination Clinic  Name:  Trevor Serrano    MRN: [id number] DOB: [birth year]  05/13/2019  Trevor Serrano was observed post Covid-19 immunization for 15 minutes without incident. He was provided with Vaccine Information Sheet and instruction to access the V-Safe system.   Trevor Serrano was instructed to call 911 with any severe reactions post vaccine: Marland Kitchen Difficulty breathing  . Swelling of face and throat  . A fast heartbeat  . A bad rash all over body  . Dizziness and weakness   Immunizations Administered    Name Date Dose VIS Date Route   Pfizer COVID-19 Vaccine 05/13/2019  8:46 AM 0.3 mL 03/11/2018 Intramuscular   Manufacturer: [employer], [employer]   Lot: [id number]   NDC: [address]-7

## 2019-12-15 DIAGNOSIS — X32XXXD Exposure to sunlight, subsequent encounter: Secondary | ICD-10-CM | POA: Diagnosis not present

## 2019-12-15 DIAGNOSIS — L570 Actinic keratosis: Secondary | ICD-10-CM | POA: Diagnosis not present

## 2020-02-04 DIAGNOSIS — Z125 Encounter for screening for malignant neoplasm of prostate: Secondary | ICD-10-CM | POA: Diagnosis not present

## 2020-02-04 DIAGNOSIS — I10 Essential (primary) hypertension: Secondary | ICD-10-CM | POA: Diagnosis not present

## 2020-02-04 DIAGNOSIS — Z0000 Encounter for general adult medical examination without abnormal findings: Secondary | ICD-10-CM | POA: Diagnosis not present

## 2020-02-04 DIAGNOSIS — E1169 Type 2 diabetes mellitus with other specified complication: Secondary | ICD-10-CM | POA: Diagnosis not present

## 2020-03-29 IMAGING — CT CT IMAGE GUIDED DRAINAGE BY PERCUTANEOUS CATHETER
1 of 2 series · 15 of 32 positions shown, 19 images · non-contrast
Comparison: none

INDICATION: Liver abscess
TECHNIQUE: Informed written consent was obtained from the patient after a
thorough discussion of the procedural risks, benefits and
alternatives. All questions were addressed. Maximal Sterile Barrier
Technique was utilized including caps, mask, sterile gowns, sterile
gloves, sterile drape, hand hygiene and skin antiseptic. A timeout
was performed prior to the initiation of the procedure.

[Series 2: i-spiral 5.0 b31f · axial · 0.98mm/px · z∈[-223,-52]mm · 15 of 55 slices shown, 19 images]
[im 3/55  soft-tissue]
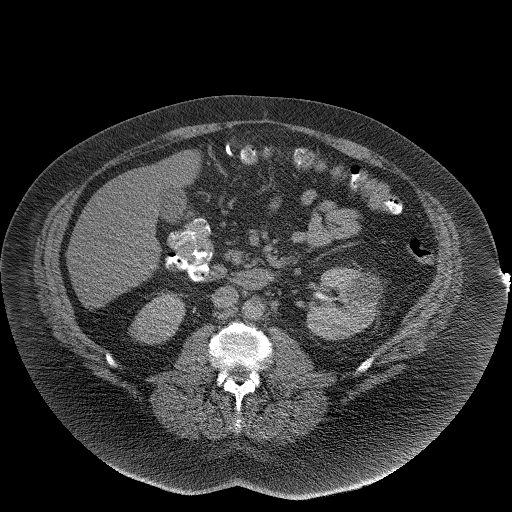
[im 3/55  bone]
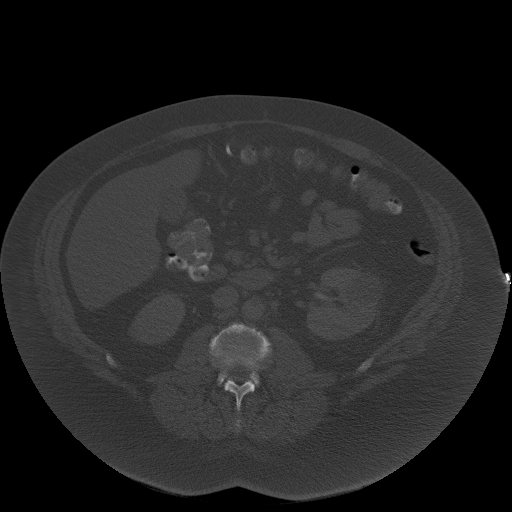
[im 8/55  soft-tissue]
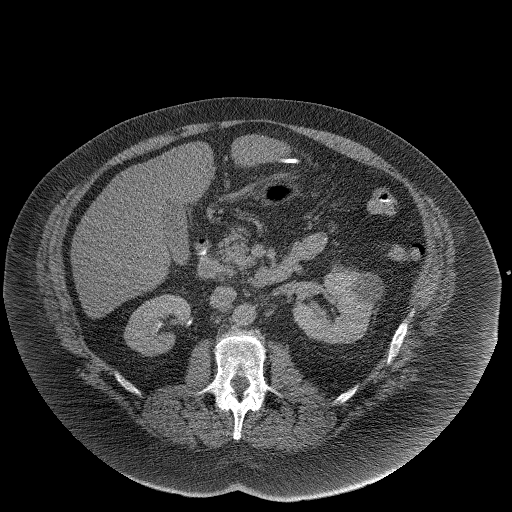
[im 13/55  soft-tissue]
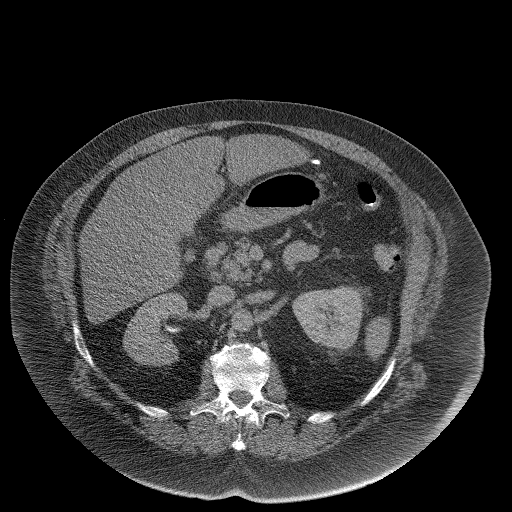
[im 15/55  soft-tissue]
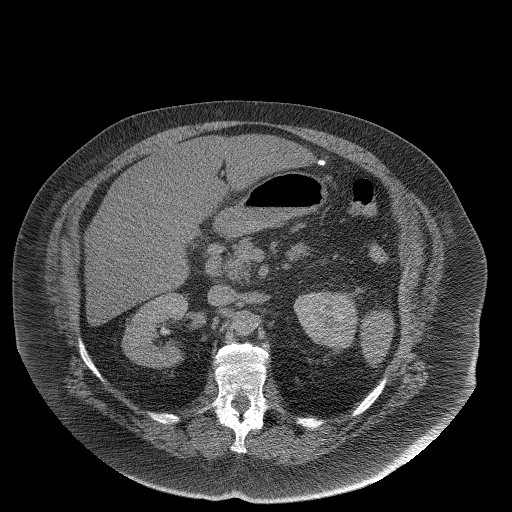
[im 20/55  soft-tissue]
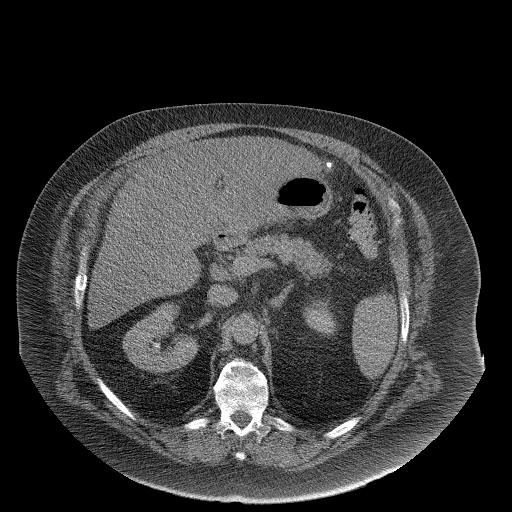
[im 23/55  soft-tissue]
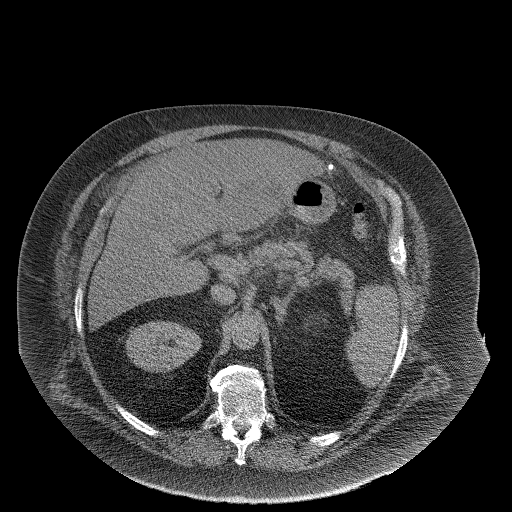
[im 28/55  soft-tissue]
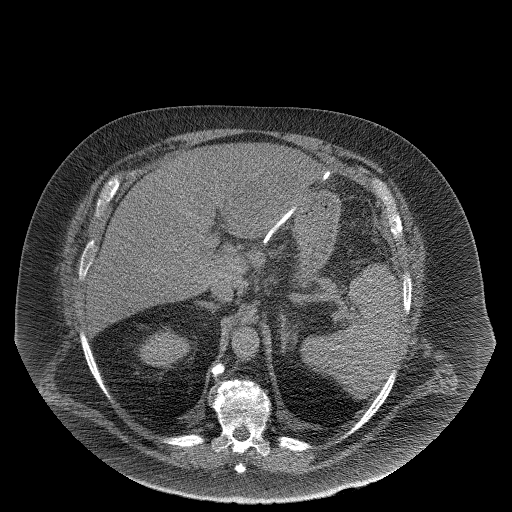
[im 32/55  soft-tissue]
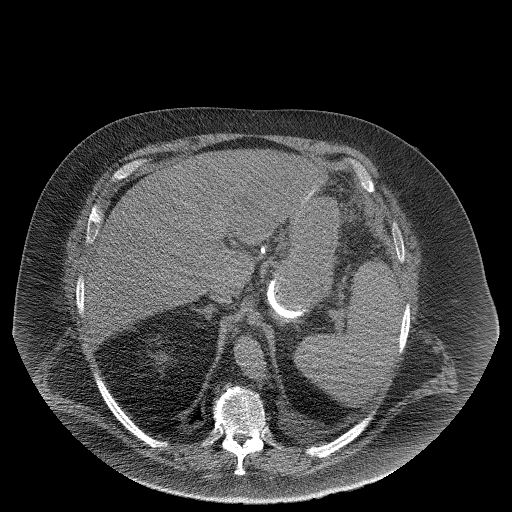
[im 35/55  soft-tissue]
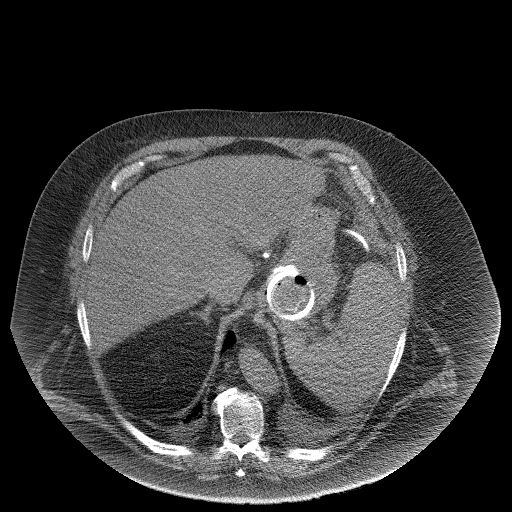
[im 35/55  bone]
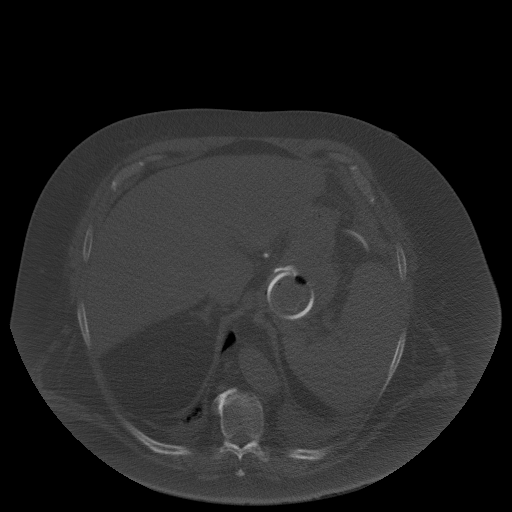
[im 40/55  soft-tissue]
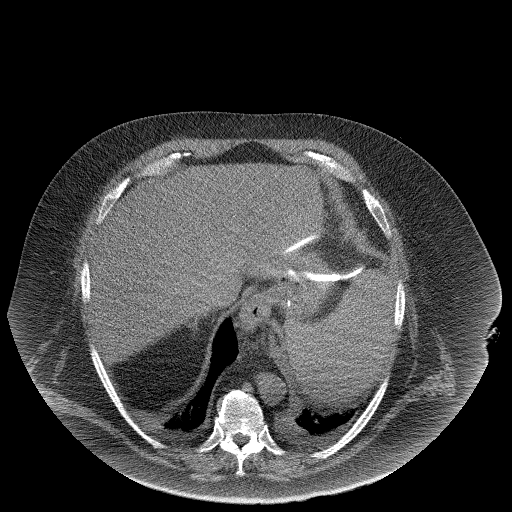
[im 42/55  soft-tissue]
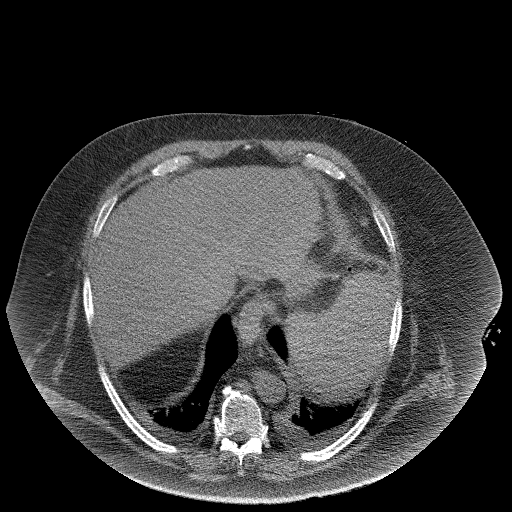
[im 45/55  lung]
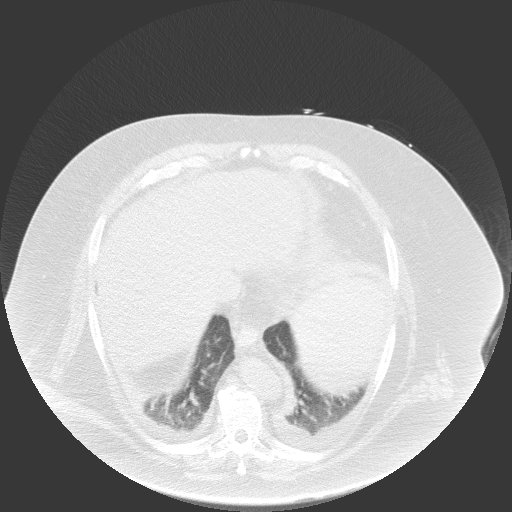
[im 47/55  soft-tissue]
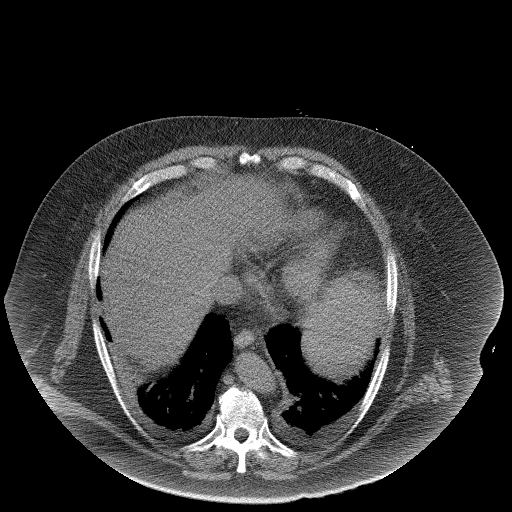
[im 47/55  lung]
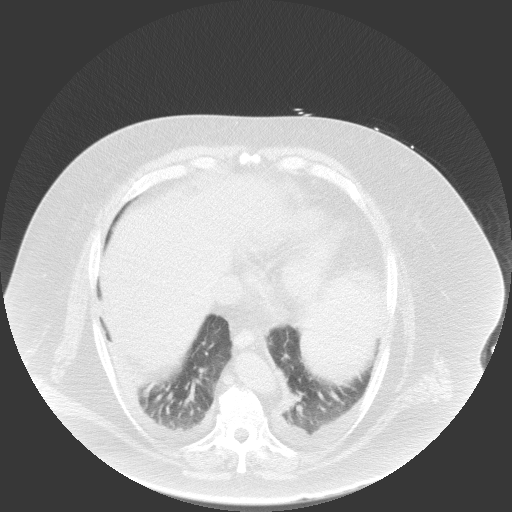
[im 50/55  lung]
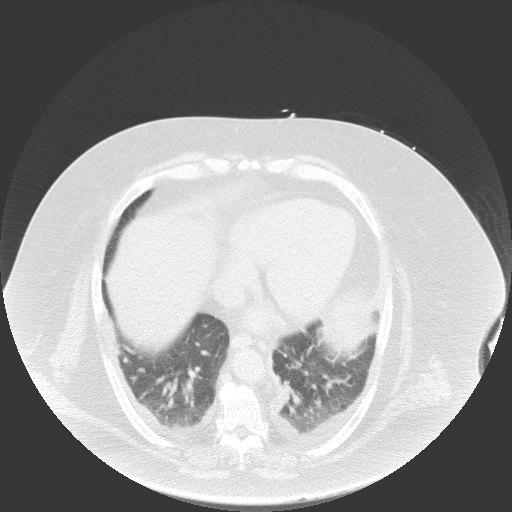
[im 52/55  soft-tissue]
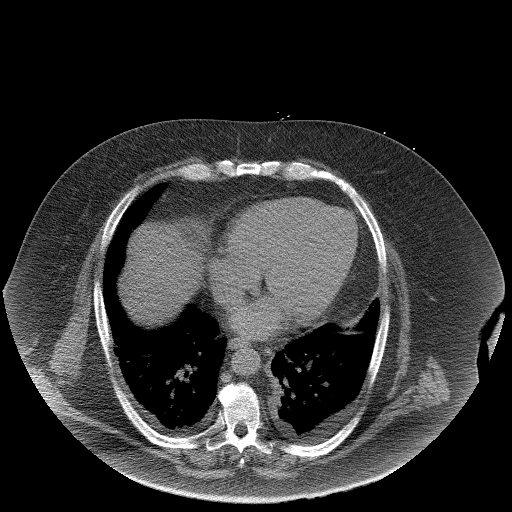
[im 52/55  lung]
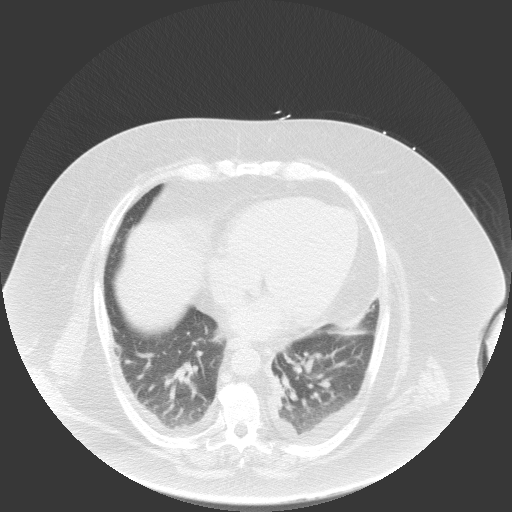

[15 of 32 positions shown; findings below may reference images not displayed]

EXAM:
CT GUIDED DRAINAGE OF HEPATIC ABSCESS

MEDICATIONS:
The patient is currently admitted to the hospital and receiving
intravenous antibiotics. The antibiotics were administered within an
appropriate time frame prior to the initiation of the procedure.

ANESTHESIA/SEDATION:
Two mg IV Versed 100 mcg IV Fentanyl

Moderate Sedation Time:  16 minutes

The patient was continuously monitored during the procedure by the
interventional radiology nurse under my direct supervision.

COMPLICATIONS:
None immediate.
PROCEDURE:
The upper abdomen was prepped with ChloraPrep in a sterile fashion,
and a sterile drape was applied covering the operative field. A
sterile gown and sterile gloves were used for the procedure. Local
anesthesia was provided with 1% Lidocaine. Under CT guidance, an 18
gauge needle was advanced into the left lobe liver abscess. The
needle was removed over an Amplatz wire. Ten French dilator followed
by a 10 French drain were inserted. It was looped and string fixed
in the fluid collection. Frank pus was aspirated.
FINDINGS: Images demonstrate placement of a 10 French drain into a hepatic
abscess in the left lobe.
IMPRESSION: Successful 10 French left lobe hepatic abscess drain placement.

## 2020-03-29 IMAGING — CT CT ABD-PELV W/ CM
2 of 5 series · 14 of 46 positions shown, 16 images · IV contrast (omnipaque)
Comparison: None.. Reported recent CT from [facility] [[email or web address]] not
available currently to compare.

ADDENDUM:
Critical Value/emergent results were called by telephone at the time
of interpretation on 07/16/2017 at [DATE] to Dr. Kim Falsdorf, General
Surgery, who verbally acknowledged these results.
CLINICAL DATA: Fever with reported liver abscess

EXAM:
CT ABDOMEN AND PELVIS WITH CONTRAST
TECHNIQUE: Multidetector CT imaging of the abdomen and pelvis was performed
using the standard protocol following bolus administration of
intravenous contrast.
CONTRAST:  75mL OMNIPAQUE IOHEXOL 300 MG/ML  SOLN

[Series 2: axial st · axial · 0.98mm/px · z∈[-479,-59]mm · 11 of 98 slices shown, 13 images]
[im 7/98  soft-tissue]
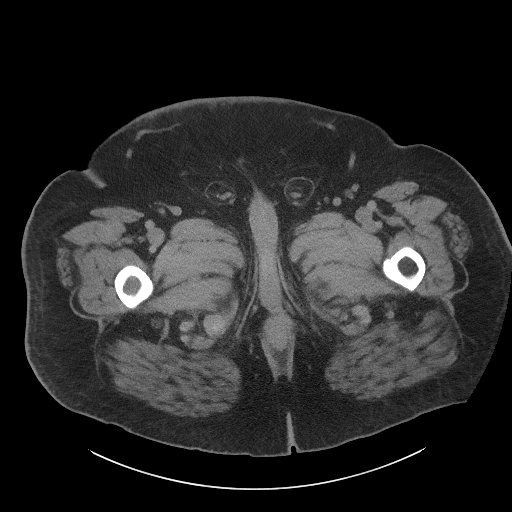
[im 7/98  bone]
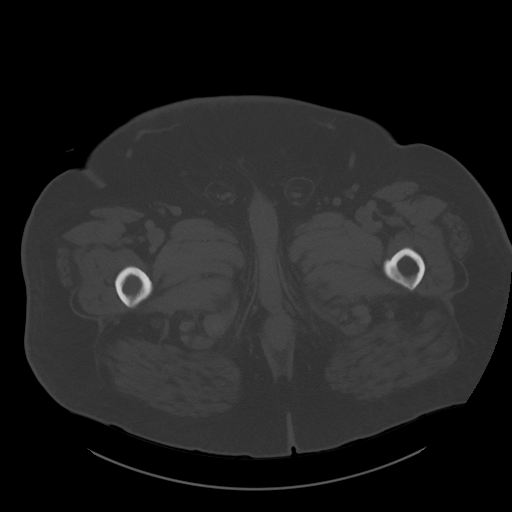
[im 19/98  soft-tissue]
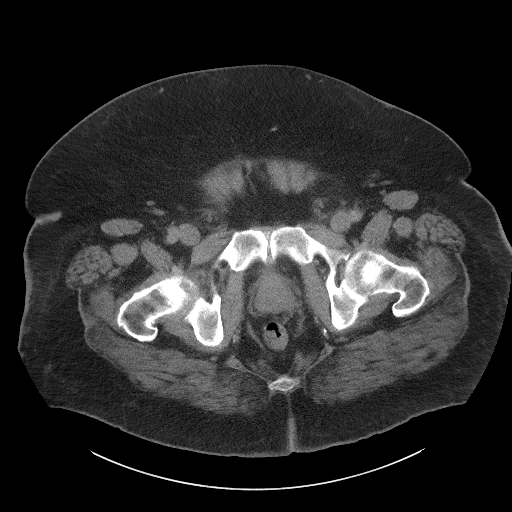
[im 25/98  soft-tissue]
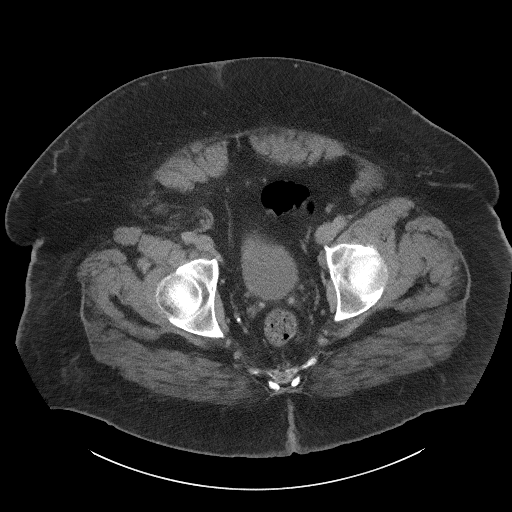
[im 31/98  soft-tissue]
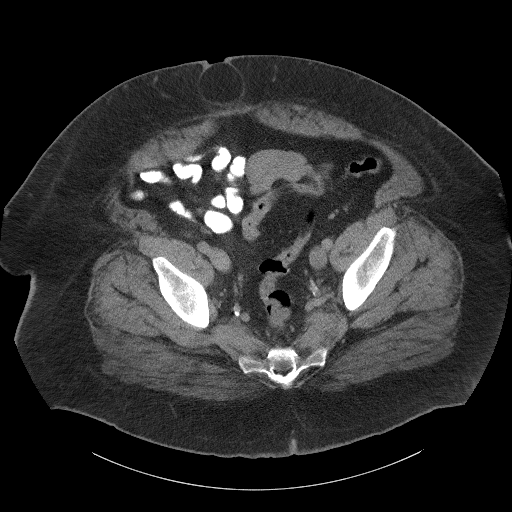
[im 43/98  soft-tissue]
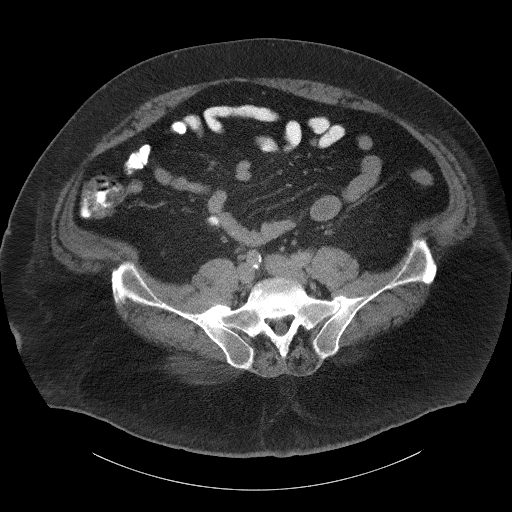
[im 49/98  soft-tissue]
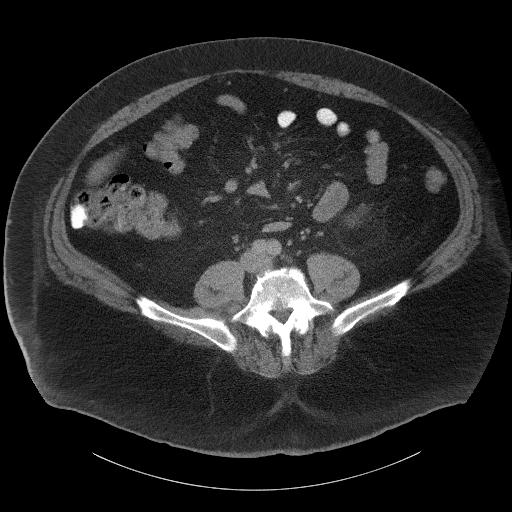
[im 55/98  soft-tissue]
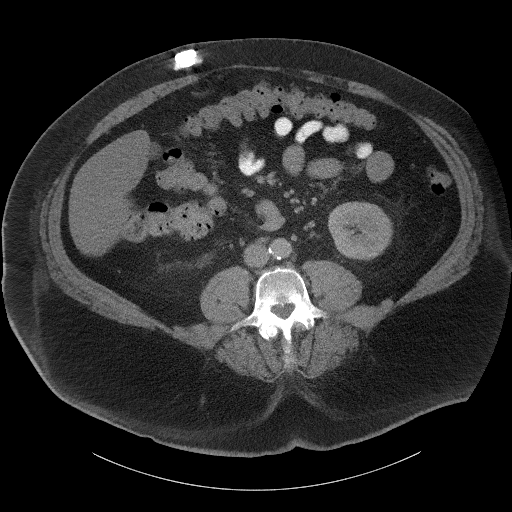
[im 67/98  soft-tissue]
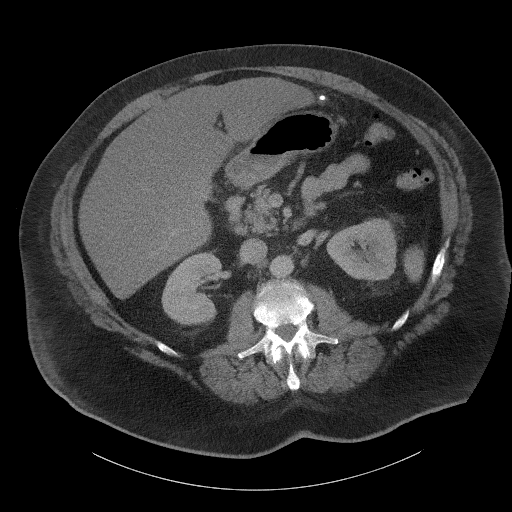
[im 73/98  soft-tissue]
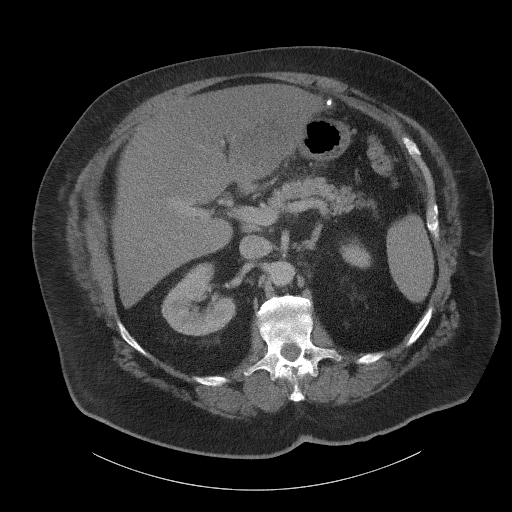
[im 73/98  bone]
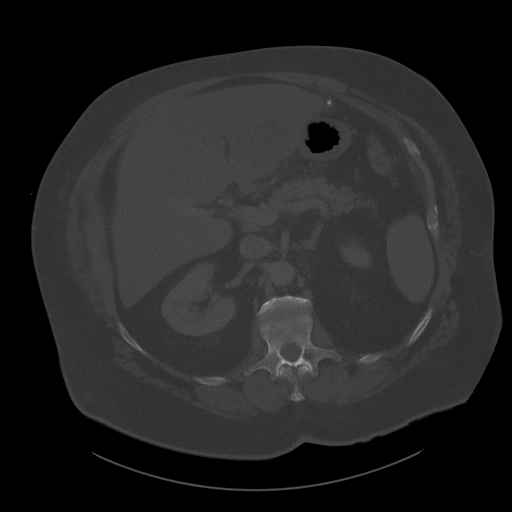
[im 79/98  soft-tissue]
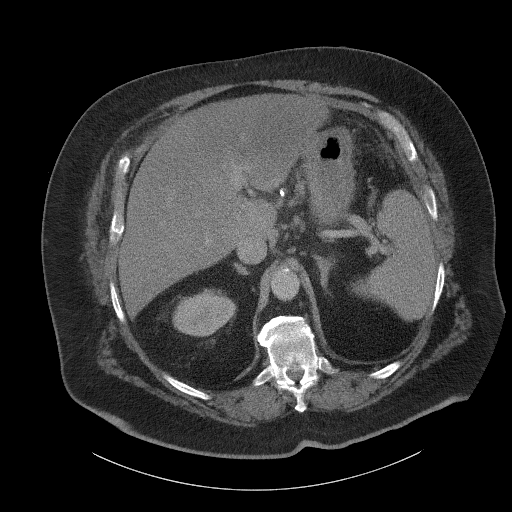
[im 91/98  soft-tissue]
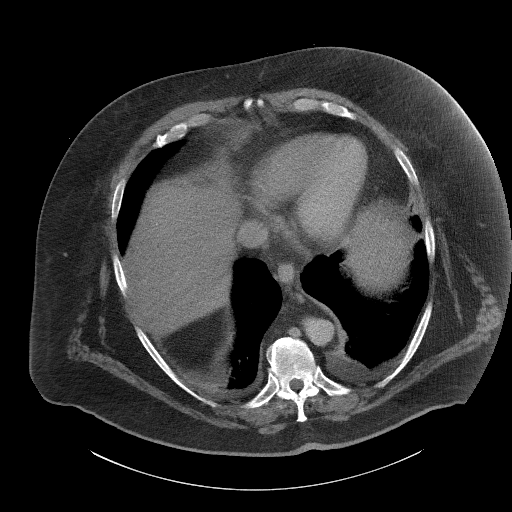

[Series 5: coronal st · coronal · 0.95mm/px · 3 of 135 slices shown]
[im 45/135  soft-tissue]
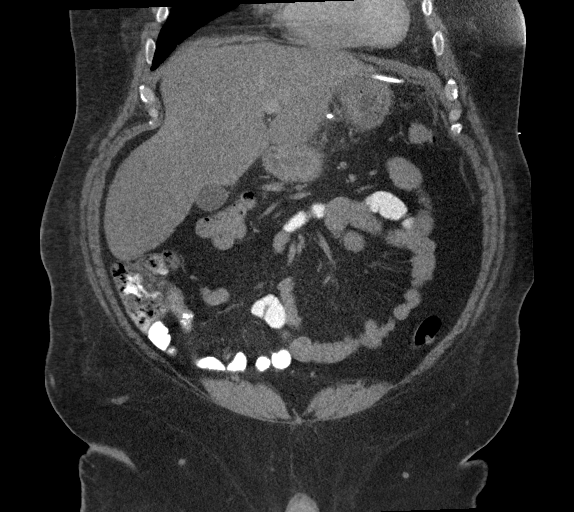
[im 60/135  soft-tissue]
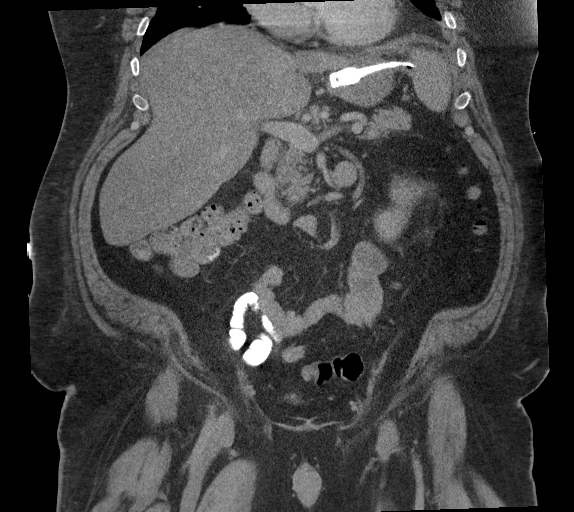
[im 75/135  soft-tissue]
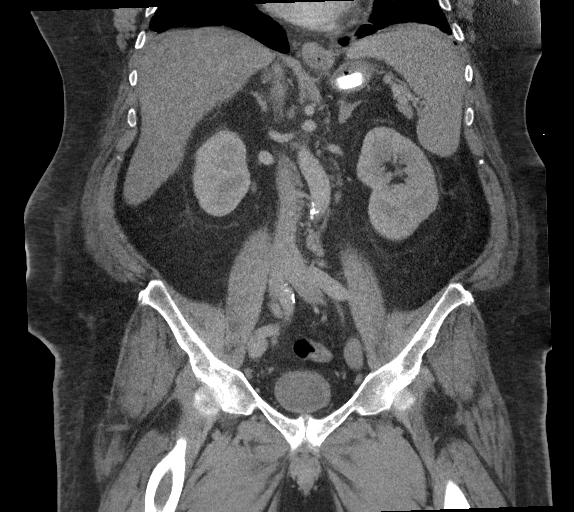

[14 of 46 positions shown; findings below may reference images not displayed]

FINDINGS: Lower chest: There are small pleural effusions bilaterally with
bibasilar atelectatic change.

Hepatobiliary: Liver measures 22.4 cm in length. There is diffuse
hepatic steatosis. There is a fluid collection involving much of the
lateral segment right lobe of the liver measuring 8.2 x 8.0 x 5.0 cm
consistent with abscess. There is no appreciable air in this area.
Note that this abscess abuts a portion of the tubing from a lap
band.

Pancreas: No pancreatic mass or inflammatory focus.

Spleen: No splenic lesions are evident. Spleen measures 14.0 x
x 11.3 cm with a measured splenic volume of 1,092 cubic cm.

Adrenals/Urinary Tract: Adrenals appear unremarkable bilaterally.
There is a cyst arising from the lateral mid left kidney measuring
4.0 x 3.3 cm. There is an 8 x 8 mm cyst in the lower pole of the
right kidney laterally. There is no hydronephrosis on either side.
There is no evident renal or ureteral calculus on either side.
Urinary bladder is midline with wall thickness within normal limits.
There is a small urachal remnant without associated mass or
inflammatory focus.

Stomach/Bowel: There is a lap band positioned at the gastric cardia.
There are foci of air outside of bowel lumen immediately adjacent to
portions of the tubing of this lap band in the left upper quadrant.
There is no appreciable bowel wall or mesenteric thickening. There
is air along the wall of the stomach near the insertion for the lap
band tubing suggesting erosion from the lap band region through the
wall of the stomach. No other free air seen. No portal venous air
evident. No evident bowel obstruction. There are scattered colonic
diverticula without diverticulitis.

Vascular/Lymphatic: There are foci of aortic and left common iliac
artery atherosclerosis. No evident aneurysm. No free air or portal
venous air.

Reproductive: There are foci of prostatic calculi. Prostate and
seminal vesicles appear normal in size and contour. No pelvic masses
evident.

Other: Appendix appears normal. Outside of the liver, there is no
evident abscess in the abdomen or pelvis. There is no appreciable
ascites in the abdomen or pelvis. There is a ventral hernia
containing only fat. There is fat in each in inguinal ring.

Musculoskeletal: There is degenerative change in the lower thoracic
and lumbar spine regions. There is anterior wedging of several lower
thoracic vertebral bodies. There is no evident blastic or lytic bone
lesion. There is no intramuscular lesion evident.
IMPRESSION: 1. Abscess in the left lobe of the liver measuring 8.1 x 8.0 x
cm. Currently no air is appreciable within this abscess. This
abscess abuts the tubing for the lap band just to the left of
midline in the left upper abdomen.

2. Lap band position in gastric cardia region. There is air tracking
along the edge of the lap band into the wall the stomach and along
portions of the tubing outside of bowel lumen. There appears to be
erosion of the lap band causing leakage of air from the upper
stomach. The amount of free air seen is slight.

3. Splenomegaly. Liver is also enlarged. There is hepatic steatosis.

4. No abscess is seen apart from the liver abscess. No ascites
evident. Appendix region appears normal. There are scattered colonic
diverticula without diverticulitis. No evident bowel obstruction.

5.  No renal or ureteral calculus.  No hydronephrosis.

6.  There is aortoiliac atherosclerosis.

7. There is a ventral hernia containing only fat. There is fat in
each inguinal ring.

8.  Small pleural effusions bilaterally with bibasilar atelectasis.

Aortic Atherosclerosis ([id number]-XI3.3).

## 2020-05-18 DIAGNOSIS — M109 Gout, unspecified: Secondary | ICD-10-CM | POA: Diagnosis not present

## 2020-05-18 DIAGNOSIS — I10 Essential (primary) hypertension: Secondary | ICD-10-CM | POA: Diagnosis not present

## 2020-05-18 DIAGNOSIS — E1169 Type 2 diabetes mellitus with other specified complication: Secondary | ICD-10-CM | POA: Diagnosis not present

## 2020-06-02 DIAGNOSIS — E782 Mixed hyperlipidemia: Secondary | ICD-10-CM | POA: Diagnosis not present

## 2020-06-02 DIAGNOSIS — E1169 Type 2 diabetes mellitus with other specified complication: Secondary | ICD-10-CM | POA: Diagnosis not present

## 2020-06-02 DIAGNOSIS — Z0000 Encounter for general adult medical examination without abnormal findings: Secondary | ICD-10-CM | POA: Diagnosis not present

## 2020-06-02 DIAGNOSIS — I10 Essential (primary) hypertension: Secondary | ICD-10-CM | POA: Diagnosis not present

## 2020-08-04 DIAGNOSIS — F3341 Major depressive disorder, recurrent, in partial remission: Secondary | ICD-10-CM | POA: Diagnosis not present

## 2020-08-04 DIAGNOSIS — E782 Mixed hyperlipidemia: Secondary | ICD-10-CM | POA: Diagnosis not present

## 2020-08-04 DIAGNOSIS — I10 Essential (primary) hypertension: Secondary | ICD-10-CM | POA: Diagnosis not present

## 2020-08-04 DIAGNOSIS — E1169 Type 2 diabetes mellitus with other specified complication: Secondary | ICD-10-CM | POA: Diagnosis not present

## 2020-09-02 DIAGNOSIS — I10 Essential (primary) hypertension: Secondary | ICD-10-CM | POA: Diagnosis not present

## 2020-09-02 DIAGNOSIS — E1169 Type 2 diabetes mellitus with other specified complication: Secondary | ICD-10-CM | POA: Diagnosis not present

## 2020-09-02 DIAGNOSIS — F3341 Major depressive disorder, recurrent, in partial remission: Secondary | ICD-10-CM | POA: Diagnosis not present

## 2020-09-02 DIAGNOSIS — R809 Proteinuria, unspecified: Secondary | ICD-10-CM | POA: Diagnosis not present

## 2020-12-12 DIAGNOSIS — E1169 Type 2 diabetes mellitus with other specified complication: Secondary | ICD-10-CM | POA: Diagnosis not present

## 2020-12-12 DIAGNOSIS — F3341 Major depressive disorder, recurrent, in partial remission: Secondary | ICD-10-CM | POA: Diagnosis not present

## 2020-12-12 DIAGNOSIS — I10 Essential (primary) hypertension: Secondary | ICD-10-CM | POA: Diagnosis not present

## 2021-01-03 DIAGNOSIS — R519 Headache, unspecified: Secondary | ICD-10-CM | POA: Diagnosis not present

## 2021-01-03 DIAGNOSIS — J029 Acute pharyngitis, unspecified: Secondary | ICD-10-CM | POA: Diagnosis not present

## 2021-01-03 DIAGNOSIS — R059 Cough, unspecified: Secondary | ICD-10-CM | POA: Diagnosis not present

## 2021-01-03 DIAGNOSIS — R0602 Shortness of breath: Secondary | ICD-10-CM | POA: Diagnosis not present

## 2021-01-03 DIAGNOSIS — Z20822 Contact with and (suspected) exposure to covid-19: Secondary | ICD-10-CM | POA: Diagnosis not present

## 2021-02-08 DIAGNOSIS — I10 Essential (primary) hypertension: Secondary | ICD-10-CM | POA: Diagnosis not present

## 2021-02-08 DIAGNOSIS — E782 Mixed hyperlipidemia: Secondary | ICD-10-CM | POA: Diagnosis not present

## 2021-02-08 DIAGNOSIS — E1169 Type 2 diabetes mellitus with other specified complication: Secondary | ICD-10-CM | POA: Diagnosis not present

## 2021-02-08 DIAGNOSIS — F3341 Major depressive disorder, recurrent, in partial remission: Secondary | ICD-10-CM | POA: Diagnosis not present

## 2021-06-14 DIAGNOSIS — Z125 Encounter for screening for malignant neoplasm of prostate: Secondary | ICD-10-CM | POA: Diagnosis not present

## 2021-06-14 DIAGNOSIS — I10 Essential (primary) hypertension: Secondary | ICD-10-CM | POA: Diagnosis not present

## 2021-06-14 DIAGNOSIS — Z0000 Encounter for general adult medical examination without abnormal findings: Secondary | ICD-10-CM | POA: Diagnosis not present

## 2021-06-14 DIAGNOSIS — M109 Gout, unspecified: Secondary | ICD-10-CM | POA: Diagnosis not present

## 2021-09-19 DIAGNOSIS — Z6841 Body Mass Index (BMI) 40.0 and over, adult: Secondary | ICD-10-CM | POA: Diagnosis not present

## 2021-09-19 DIAGNOSIS — E1169 Type 2 diabetes mellitus with other specified complication: Secondary | ICD-10-CM | POA: Diagnosis not present

## 2021-09-19 DIAGNOSIS — F3341 Major depressive disorder, recurrent, in partial remission: Secondary | ICD-10-CM | POA: Diagnosis not present

## 2021-09-19 DIAGNOSIS — I10 Essential (primary) hypertension: Secondary | ICD-10-CM | POA: Diagnosis not present

## 2021-12-15 DIAGNOSIS — R809 Proteinuria, unspecified: Secondary | ICD-10-CM | POA: Diagnosis not present

## 2021-12-15 DIAGNOSIS — F3341 Major depressive disorder, recurrent, in partial remission: Secondary | ICD-10-CM | POA: Diagnosis not present

## 2021-12-15 DIAGNOSIS — E1169 Type 2 diabetes mellitus with other specified complication: Secondary | ICD-10-CM | POA: Diagnosis not present

## 2021-12-15 DIAGNOSIS — I10 Essential (primary) hypertension: Secondary | ICD-10-CM | POA: Diagnosis not present

## 2022-06-26 DIAGNOSIS — Z136 Encounter for screening for cardiovascular disorders: Secondary | ICD-10-CM | POA: Diagnosis not present

## 2022-06-26 DIAGNOSIS — Z6841 Body Mass Index (BMI) 40.0 and over, adult: Secondary | ICD-10-CM | POA: Diagnosis not present

## 2022-06-26 DIAGNOSIS — F3341 Major depressive disorder, recurrent, in partial remission: Secondary | ICD-10-CM | POA: Diagnosis not present

## 2022-06-26 DIAGNOSIS — E782 Mixed hyperlipidemia: Secondary | ICD-10-CM | POA: Diagnosis not present

## 2022-06-26 DIAGNOSIS — I10 Essential (primary) hypertension: Secondary | ICD-10-CM | POA: Diagnosis not present

## 2022-06-26 DIAGNOSIS — E1165 Type 2 diabetes mellitus with hyperglycemia: Secondary | ICD-10-CM | POA: Diagnosis not present

## 2022-06-26 DIAGNOSIS — Z0000 Encounter for general adult medical examination without abnormal findings: Secondary | ICD-10-CM | POA: Diagnosis not present

## 2022-06-26 DIAGNOSIS — Z125 Encounter for screening for malignant neoplasm of prostate: Secondary | ICD-10-CM | POA: Diagnosis not present

## 2022-06-26 DIAGNOSIS — M109 Gout, unspecified: Secondary | ICD-10-CM | POA: Diagnosis not present

## 2022-06-26 DIAGNOSIS — R809 Proteinuria, unspecified: Secondary | ICD-10-CM | POA: Diagnosis not present

## 2022-07-09 DIAGNOSIS — L820 Inflamed seborrheic keratosis: Secondary | ICD-10-CM | POA: Diagnosis not present

## 2022-07-09 DIAGNOSIS — L570 Actinic keratosis: Secondary | ICD-10-CM | POA: Diagnosis not present

## 2022-07-09 DIAGNOSIS — X32XXXD Exposure to sunlight, subsequent encounter: Secondary | ICD-10-CM | POA: Diagnosis not present

## 2022-07-09 DIAGNOSIS — L280 Lichen simplex chronicus: Secondary | ICD-10-CM | POA: Diagnosis not present

## 2022-08-09 DIAGNOSIS — Z03818 Encounter for observation for suspected exposure to other biological agents ruled out: Secondary | ICD-10-CM | POA: Diagnosis not present

## 2022-08-09 DIAGNOSIS — R051 Acute cough: Secondary | ICD-10-CM | POA: Diagnosis not present

## 2022-08-09 DIAGNOSIS — R059 Cough, unspecified: Secondary | ICD-10-CM | POA: Diagnosis not present

## 2022-08-21 DIAGNOSIS — M109 Gout, unspecified: Secondary | ICD-10-CM | POA: Diagnosis not present

## 2022-08-21 DIAGNOSIS — J209 Acute bronchitis, unspecified: Secondary | ICD-10-CM | POA: Diagnosis not present

## 2022-08-21 DIAGNOSIS — I10 Essential (primary) hypertension: Secondary | ICD-10-CM | POA: Diagnosis not present

## 2022-10-31 DIAGNOSIS — M25562 Pain in left knee: Secondary | ICD-10-CM | POA: Diagnosis not present

## 2022-11-07 DIAGNOSIS — M25562 Pain in left knee: Secondary | ICD-10-CM | POA: Diagnosis not present

## 2022-11-14 DIAGNOSIS — M25562 Pain in left knee: Secondary | ICD-10-CM | POA: Diagnosis not present

## 2022-12-17 DIAGNOSIS — M25562 Pain in left knee: Secondary | ICD-10-CM | POA: Diagnosis not present

## 2022-12-31 DIAGNOSIS — M25562 Pain in left knee: Secondary | ICD-10-CM | POA: Diagnosis not present

## 2023-01-07 DIAGNOSIS — M25562 Pain in left knee: Secondary | ICD-10-CM | POA: Diagnosis not present

## 2023-01-07 DIAGNOSIS — M1712 Unilateral primary osteoarthritis, left knee: Secondary | ICD-10-CM | POA: Diagnosis not present

## 2023-01-18 DIAGNOSIS — I10 Essential (primary) hypertension: Secondary | ICD-10-CM | POA: Diagnosis not present

## 2023-01-18 DIAGNOSIS — E1165 Type 2 diabetes mellitus with hyperglycemia: Secondary | ICD-10-CM | POA: Diagnosis not present

## 2023-01-18 DIAGNOSIS — S83242D Other tear of medial meniscus, current injury, left knee, subsequent encounter: Secondary | ICD-10-CM | POA: Diagnosis not present

## 2023-01-18 DIAGNOSIS — E1169 Type 2 diabetes mellitus with other specified complication: Secondary | ICD-10-CM | POA: Diagnosis not present

## 2023-01-18 DIAGNOSIS — M109 Gout, unspecified: Secondary | ICD-10-CM | POA: Diagnosis not present

## 2023-01-18 DIAGNOSIS — F3341 Major depressive disorder, recurrent, in partial remission: Secondary | ICD-10-CM | POA: Diagnosis not present

## 2023-02-11 DIAGNOSIS — M1712 Unilateral primary osteoarthritis, left knee: Secondary | ICD-10-CM | POA: Diagnosis not present

## 2023-02-14 DIAGNOSIS — M1712 Unilateral primary osteoarthritis, left knee: Secondary | ICD-10-CM | POA: Diagnosis not present

## 2023-03-07 DIAGNOSIS — I10 Essential (primary) hypertension: Secondary | ICD-10-CM | POA: Diagnosis not present

## 2023-03-07 DIAGNOSIS — Z6841 Body Mass Index (BMI) 40.0 and over, adult: Secondary | ICD-10-CM | POA: Diagnosis not present

## 2023-03-07 DIAGNOSIS — E119 Type 2 diabetes mellitus without complications: Secondary | ICD-10-CM | POA: Diagnosis not present

## 2023-03-07 DIAGNOSIS — Z8679 Personal history of other diseases of the circulatory system: Secondary | ICD-10-CM | POA: Diagnosis not present

## 2023-03-07 DIAGNOSIS — E66813 Obesity, class 3: Secondary | ICD-10-CM | POA: Diagnosis not present

## 2023-03-07 DIAGNOSIS — K219 Gastro-esophageal reflux disease without esophagitis: Secondary | ICD-10-CM | POA: Diagnosis not present

## 2023-03-07 DIAGNOSIS — F32A Depression, unspecified: Secondary | ICD-10-CM | POA: Diagnosis not present

## 2023-03-07 DIAGNOSIS — G4733 Obstructive sleep apnea (adult) (pediatric): Secondary | ICD-10-CM | POA: Diagnosis not present

## 2023-03-07 DIAGNOSIS — Z720 Tobacco use: Secondary | ICD-10-CM | POA: Diagnosis not present

## 2023-03-07 DIAGNOSIS — M109 Gout, unspecified: Secondary | ICD-10-CM | POA: Diagnosis not present

## 2023-03-07 DIAGNOSIS — G894 Chronic pain syndrome: Secondary | ICD-10-CM | POA: Diagnosis not present

## 2023-03-07 DIAGNOSIS — Z01818 Encounter for other preprocedural examination: Secondary | ICD-10-CM | POA: Diagnosis not present

## 2023-03-07 DIAGNOSIS — D72829 Elevated white blood cell count, unspecified: Secondary | ICD-10-CM | POA: Diagnosis not present

## 2023-03-07 DIAGNOSIS — E785 Hyperlipidemia, unspecified: Secondary | ICD-10-CM | POA: Diagnosis not present

## 2023-03-07 DIAGNOSIS — M1712 Unilateral primary osteoarthritis, left knee: Secondary | ICD-10-CM | POA: Diagnosis not present

## 2023-03-25 DIAGNOSIS — Z96652 Presence of left artificial knee joint: Secondary | ICD-10-CM | POA: Diagnosis not present

## 2023-03-25 DIAGNOSIS — J449 Chronic obstructive pulmonary disease, unspecified: Secondary | ICD-10-CM | POA: Diagnosis not present

## 2023-03-25 DIAGNOSIS — Z87891 Personal history of nicotine dependence: Secondary | ICD-10-CM | POA: Diagnosis not present

## 2023-03-25 DIAGNOSIS — K219 Gastro-esophageal reflux disease without esophagitis: Secondary | ICD-10-CM | POA: Diagnosis not present

## 2023-03-25 DIAGNOSIS — Z471 Aftercare following joint replacement surgery: Secondary | ICD-10-CM | POA: Diagnosis not present

## 2023-03-25 DIAGNOSIS — E119 Type 2 diabetes mellitus without complications: Secondary | ICD-10-CM | POA: Diagnosis not present

## 2023-03-25 DIAGNOSIS — M1712 Unilateral primary osteoarthritis, left knee: Secondary | ICD-10-CM | POA: Diagnosis not present

## 2023-03-25 DIAGNOSIS — G8918 Other acute postprocedural pain: Secondary | ICD-10-CM | POA: Diagnosis not present

## 2023-03-26 DIAGNOSIS — K219 Gastro-esophageal reflux disease without esophagitis: Secondary | ICD-10-CM | POA: Diagnosis not present

## 2023-03-26 DIAGNOSIS — J449 Chronic obstructive pulmonary disease, unspecified: Secondary | ICD-10-CM | POA: Diagnosis not present

## 2023-03-26 DIAGNOSIS — I10 Essential (primary) hypertension: Secondary | ICD-10-CM | POA: Diagnosis not present

## 2023-03-26 DIAGNOSIS — I2510 Atherosclerotic heart disease of native coronary artery without angina pectoris: Secondary | ICD-10-CM | POA: Diagnosis not present

## 2023-03-26 DIAGNOSIS — Z87891 Personal history of nicotine dependence: Secondary | ICD-10-CM | POA: Diagnosis not present

## 2023-03-26 DIAGNOSIS — R0902 Hypoxemia: Secondary | ICD-10-CM | POA: Diagnosis not present

## 2023-03-26 DIAGNOSIS — M1712 Unilateral primary osteoarthritis, left knee: Secondary | ICD-10-CM | POA: Diagnosis not present

## 2023-03-26 DIAGNOSIS — Z96652 Presence of left artificial knee joint: Secondary | ICD-10-CM | POA: Diagnosis not present

## 2023-03-26 DIAGNOSIS — E119 Type 2 diabetes mellitus without complications: Secondary | ICD-10-CM | POA: Diagnosis not present

## 2023-03-26 DIAGNOSIS — J9601 Acute respiratory failure with hypoxia: Secondary | ICD-10-CM | POA: Diagnosis not present

## 2023-03-26 DIAGNOSIS — K449 Diaphragmatic hernia without obstruction or gangrene: Secondary | ICD-10-CM | POA: Diagnosis not present

## 2023-03-27 DIAGNOSIS — E119 Type 2 diabetes mellitus without complications: Secondary | ICD-10-CM | POA: Diagnosis not present

## 2023-03-27 DIAGNOSIS — Z96652 Presence of left artificial knee joint: Secondary | ICD-10-CM | POA: Diagnosis not present

## 2023-03-27 DIAGNOSIS — I10 Essential (primary) hypertension: Secondary | ICD-10-CM | POA: Diagnosis not present

## 2023-03-27 DIAGNOSIS — M1712 Unilateral primary osteoarthritis, left knee: Secondary | ICD-10-CM | POA: Diagnosis not present

## 2023-03-27 DIAGNOSIS — J449 Chronic obstructive pulmonary disease, unspecified: Secondary | ICD-10-CM | POA: Diagnosis not present

## 2023-03-27 DIAGNOSIS — J9601 Acute respiratory failure with hypoxia: Secondary | ICD-10-CM | POA: Diagnosis not present

## 2023-03-27 DIAGNOSIS — K219 Gastro-esophageal reflux disease without esophagitis: Secondary | ICD-10-CM | POA: Diagnosis not present

## 2023-03-27 DIAGNOSIS — Z87891 Personal history of nicotine dependence: Secondary | ICD-10-CM | POA: Diagnosis not present

## 2023-03-28 DIAGNOSIS — M51369 Other intervertebral disc degeneration, lumbar region without mention of lumbar back pain or lower extremity pain: Secondary | ICD-10-CM | POA: Diagnosis not present

## 2023-03-28 DIAGNOSIS — M47816 Spondylosis without myelopathy or radiculopathy, lumbar region: Secondary | ICD-10-CM | POA: Diagnosis not present

## 2023-03-28 DIAGNOSIS — Z471 Aftercare following joint replacement surgery: Secondary | ICD-10-CM | POA: Diagnosis not present

## 2023-03-28 DIAGNOSIS — I10 Essential (primary) hypertension: Secondary | ICD-10-CM | POA: Diagnosis not present

## 2023-03-28 DIAGNOSIS — Z96652 Presence of left artificial knee joint: Secondary | ICD-10-CM | POA: Diagnosis not present

## 2023-03-28 DIAGNOSIS — Z7982 Long term (current) use of aspirin: Secondary | ICD-10-CM | POA: Diagnosis not present

## 2023-03-28 DIAGNOSIS — M109 Gout, unspecified: Secondary | ICD-10-CM | POA: Diagnosis not present

## 2023-03-28 DIAGNOSIS — F32A Depression, unspecified: Secondary | ICD-10-CM | POA: Diagnosis not present

## 2023-03-28 DIAGNOSIS — F17210 Nicotine dependence, cigarettes, uncomplicated: Secondary | ICD-10-CM | POA: Diagnosis not present

## 2023-03-28 DIAGNOSIS — Z7984 Long term (current) use of oral hypoglycemic drugs: Secondary | ICD-10-CM | POA: Diagnosis not present

## 2023-03-28 DIAGNOSIS — E119 Type 2 diabetes mellitus without complications: Secondary | ICD-10-CM | POA: Diagnosis not present

## 2023-03-29 DIAGNOSIS — Z7984 Long term (current) use of oral hypoglycemic drugs: Secondary | ICD-10-CM | POA: Diagnosis not present

## 2023-03-29 DIAGNOSIS — E119 Type 2 diabetes mellitus without complications: Secondary | ICD-10-CM | POA: Diagnosis not present

## 2023-03-29 DIAGNOSIS — Z7982 Long term (current) use of aspirin: Secondary | ICD-10-CM | POA: Diagnosis not present

## 2023-03-29 DIAGNOSIS — M109 Gout, unspecified: Secondary | ICD-10-CM | POA: Diagnosis not present

## 2023-03-29 DIAGNOSIS — Z96652 Presence of left artificial knee joint: Secondary | ICD-10-CM | POA: Diagnosis not present

## 2023-03-29 DIAGNOSIS — M51369 Other intervertebral disc degeneration, lumbar region without mention of lumbar back pain or lower extremity pain: Secondary | ICD-10-CM | POA: Diagnosis not present

## 2023-03-29 DIAGNOSIS — Z471 Aftercare following joint replacement surgery: Secondary | ICD-10-CM | POA: Diagnosis not present

## 2023-03-29 DIAGNOSIS — F17210 Nicotine dependence, cigarettes, uncomplicated: Secondary | ICD-10-CM | POA: Diagnosis not present

## 2023-03-29 DIAGNOSIS — I10 Essential (primary) hypertension: Secondary | ICD-10-CM | POA: Diagnosis not present

## 2023-03-29 DIAGNOSIS — F32A Depression, unspecified: Secondary | ICD-10-CM | POA: Diagnosis not present

## 2023-03-29 DIAGNOSIS — M47816 Spondylosis without myelopathy or radiculopathy, lumbar region: Secondary | ICD-10-CM | POA: Diagnosis not present

## 2023-03-30 DIAGNOSIS — M109 Gout, unspecified: Secondary | ICD-10-CM | POA: Diagnosis not present

## 2023-03-30 DIAGNOSIS — M47816 Spondylosis without myelopathy or radiculopathy, lumbar region: Secondary | ICD-10-CM | POA: Diagnosis not present

## 2023-03-30 DIAGNOSIS — F32A Depression, unspecified: Secondary | ICD-10-CM | POA: Diagnosis not present

## 2023-03-30 DIAGNOSIS — M51369 Other intervertebral disc degeneration, lumbar region without mention of lumbar back pain or lower extremity pain: Secondary | ICD-10-CM | POA: Diagnosis not present

## 2023-03-30 DIAGNOSIS — I10 Essential (primary) hypertension: Secondary | ICD-10-CM | POA: Diagnosis not present

## 2023-03-30 DIAGNOSIS — Z96652 Presence of left artificial knee joint: Secondary | ICD-10-CM | POA: Diagnosis not present

## 2023-03-30 DIAGNOSIS — E119 Type 2 diabetes mellitus without complications: Secondary | ICD-10-CM | POA: Diagnosis not present

## 2023-03-30 DIAGNOSIS — F17210 Nicotine dependence, cigarettes, uncomplicated: Secondary | ICD-10-CM | POA: Diagnosis not present

## 2023-03-30 DIAGNOSIS — Z7982 Long term (current) use of aspirin: Secondary | ICD-10-CM | POA: Diagnosis not present

## 2023-03-30 DIAGNOSIS — Z471 Aftercare following joint replacement surgery: Secondary | ICD-10-CM | POA: Diagnosis not present

## 2023-03-30 DIAGNOSIS — Z7984 Long term (current) use of oral hypoglycemic drugs: Secondary | ICD-10-CM | POA: Diagnosis not present

## 2023-04-01 DIAGNOSIS — E119 Type 2 diabetes mellitus without complications: Secondary | ICD-10-CM | POA: Diagnosis not present

## 2023-04-01 DIAGNOSIS — F32A Depression, unspecified: Secondary | ICD-10-CM | POA: Diagnosis not present

## 2023-04-01 DIAGNOSIS — M109 Gout, unspecified: Secondary | ICD-10-CM | POA: Diagnosis not present

## 2023-04-01 DIAGNOSIS — M51369 Other intervertebral disc degeneration, lumbar region without mention of lumbar back pain or lower extremity pain: Secondary | ICD-10-CM | POA: Diagnosis not present

## 2023-04-01 DIAGNOSIS — I10 Essential (primary) hypertension: Secondary | ICD-10-CM | POA: Diagnosis not present

## 2023-04-01 DIAGNOSIS — Z7982 Long term (current) use of aspirin: Secondary | ICD-10-CM | POA: Diagnosis not present

## 2023-04-01 DIAGNOSIS — F17210 Nicotine dependence, cigarettes, uncomplicated: Secondary | ICD-10-CM | POA: Diagnosis not present

## 2023-04-01 DIAGNOSIS — Z471 Aftercare following joint replacement surgery: Secondary | ICD-10-CM | POA: Diagnosis not present

## 2023-04-01 DIAGNOSIS — Z7984 Long term (current) use of oral hypoglycemic drugs: Secondary | ICD-10-CM | POA: Diagnosis not present

## 2023-04-01 DIAGNOSIS — M47816 Spondylosis without myelopathy or radiculopathy, lumbar region: Secondary | ICD-10-CM | POA: Diagnosis not present

## 2023-04-01 DIAGNOSIS — Z96652 Presence of left artificial knee joint: Secondary | ICD-10-CM | POA: Diagnosis not present

## 2023-04-03 DIAGNOSIS — M51369 Other intervertebral disc degeneration, lumbar region without mention of lumbar back pain or lower extremity pain: Secondary | ICD-10-CM | POA: Diagnosis not present

## 2023-04-03 DIAGNOSIS — Z471 Aftercare following joint replacement surgery: Secondary | ICD-10-CM | POA: Diagnosis not present

## 2023-04-03 DIAGNOSIS — M109 Gout, unspecified: Secondary | ICD-10-CM | POA: Diagnosis not present

## 2023-04-03 DIAGNOSIS — F32A Depression, unspecified: Secondary | ICD-10-CM | POA: Diagnosis not present

## 2023-04-03 DIAGNOSIS — Z96652 Presence of left artificial knee joint: Secondary | ICD-10-CM | POA: Diagnosis not present

## 2023-04-03 DIAGNOSIS — Z7984 Long term (current) use of oral hypoglycemic drugs: Secondary | ICD-10-CM | POA: Diagnosis not present

## 2023-04-03 DIAGNOSIS — Z7982 Long term (current) use of aspirin: Secondary | ICD-10-CM | POA: Diagnosis not present

## 2023-04-03 DIAGNOSIS — E119 Type 2 diabetes mellitus without complications: Secondary | ICD-10-CM | POA: Diagnosis not present

## 2023-04-03 DIAGNOSIS — F17210 Nicotine dependence, cigarettes, uncomplicated: Secondary | ICD-10-CM | POA: Diagnosis not present

## 2023-04-03 DIAGNOSIS — M47816 Spondylosis without myelopathy or radiculopathy, lumbar region: Secondary | ICD-10-CM | POA: Diagnosis not present

## 2023-04-03 DIAGNOSIS — I10 Essential (primary) hypertension: Secondary | ICD-10-CM | POA: Diagnosis not present

## 2023-04-04 DIAGNOSIS — M25562 Pain in left knee: Secondary | ICD-10-CM | POA: Diagnosis not present

## 2023-04-08 DIAGNOSIS — M25562 Pain in left knee: Secondary | ICD-10-CM | POA: Diagnosis not present

## 2023-04-09 DIAGNOSIS — Z96652 Presence of left artificial knee joint: Secondary | ICD-10-CM | POA: Diagnosis not present

## 2023-04-11 DIAGNOSIS — M25562 Pain in left knee: Secondary | ICD-10-CM | POA: Diagnosis not present

## 2023-04-16 DIAGNOSIS — M25562 Pain in left knee: Secondary | ICD-10-CM | POA: Diagnosis not present

## 2023-04-18 DIAGNOSIS — M25562 Pain in left knee: Secondary | ICD-10-CM | POA: Diagnosis not present

## 2023-04-23 DIAGNOSIS — M25562 Pain in left knee: Secondary | ICD-10-CM | POA: Diagnosis not present

## 2023-04-25 DIAGNOSIS — M25562 Pain in left knee: Secondary | ICD-10-CM | POA: Diagnosis not present

## 2023-04-30 DIAGNOSIS — M25562 Pain in left knee: Secondary | ICD-10-CM | POA: Diagnosis not present

## 2023-04-30 DIAGNOSIS — Z96652 Presence of left artificial knee joint: Secondary | ICD-10-CM | POA: Diagnosis not present

## 2023-05-07 DIAGNOSIS — M25562 Pain in left knee: Secondary | ICD-10-CM | POA: Diagnosis not present

## 2023-06-19 DIAGNOSIS — L570 Actinic keratosis: Secondary | ICD-10-CM | POA: Diagnosis not present

## 2023-06-19 DIAGNOSIS — L821 Other seborrheic keratosis: Secondary | ICD-10-CM | POA: Diagnosis not present

## 2023-06-19 DIAGNOSIS — X32XXXD Exposure to sunlight, subsequent encounter: Secondary | ICD-10-CM | POA: Diagnosis not present

## 2023-07-02 DIAGNOSIS — Z96652 Presence of left artificial knee joint: Secondary | ICD-10-CM | POA: Diagnosis not present

## 2023-07-16 DIAGNOSIS — R809 Proteinuria, unspecified: Secondary | ICD-10-CM | POA: Diagnosis not present

## 2023-07-16 DIAGNOSIS — M109 Gout, unspecified: Secondary | ICD-10-CM | POA: Diagnosis not present

## 2023-07-16 DIAGNOSIS — Z0000 Encounter for general adult medical examination without abnormal findings: Secondary | ICD-10-CM | POA: Diagnosis not present

## 2023-07-16 DIAGNOSIS — Z23 Encounter for immunization: Secondary | ICD-10-CM | POA: Diagnosis not present

## 2023-07-16 DIAGNOSIS — Z125 Encounter for screening for malignant neoplasm of prostate: Secondary | ICD-10-CM | POA: Diagnosis not present

## 2023-07-16 DIAGNOSIS — E1169 Type 2 diabetes mellitus with other specified complication: Secondary | ICD-10-CM | POA: Diagnosis not present

## 2023-07-16 DIAGNOSIS — I10 Essential (primary) hypertension: Secondary | ICD-10-CM | POA: Diagnosis not present

## 2023-07-16 DIAGNOSIS — E782 Mixed hyperlipidemia: Secondary | ICD-10-CM | POA: Diagnosis not present

## 2023-11-28 DIAGNOSIS — Z6841 Body Mass Index (BMI) 40.0 and over, adult: Secondary | ICD-10-CM | POA: Diagnosis not present

## 2023-11-28 DIAGNOSIS — R059 Cough, unspecified: Secondary | ICD-10-CM | POA: Diagnosis not present

## 2023-12-18 DIAGNOSIS — L570 Actinic keratosis: Secondary | ICD-10-CM | POA: Diagnosis not present

## 2023-12-18 DIAGNOSIS — X32XXXD Exposure to sunlight, subsequent encounter: Secondary | ICD-10-CM | POA: Diagnosis not present
